# Patient Record
Sex: Female | Born: 1935 | Race: White | Hispanic: No | Smoking: Never smoker
Health system: Southern US, Community
[De-identification: ages and names within clinical notes are randomized; demographics above are authoritative.]

## PROBLEM LIST (undated history)

## (undated) DIAGNOSIS — N183 Chronic kidney disease, stage 3 unspecified: Secondary | ICD-10-CM

## (undated) DIAGNOSIS — B019 Varicella without complication: Secondary | ICD-10-CM

## (undated) DIAGNOSIS — D509 Iron deficiency anemia, unspecified: Secondary | ICD-10-CM

## (undated) DIAGNOSIS — I219 Acute myocardial infarction, unspecified: Secondary | ICD-10-CM

## (undated) DIAGNOSIS — I509 Heart failure, unspecified: Secondary | ICD-10-CM

## (undated) DIAGNOSIS — K219 Gastro-esophageal reflux disease without esophagitis: Secondary | ICD-10-CM

## (undated) DIAGNOSIS — Z8711 Personal history of peptic ulcer disease: Secondary | ICD-10-CM

## (undated) DIAGNOSIS — G20 Parkinson's disease: Secondary | ICD-10-CM

## (undated) DIAGNOSIS — R32 Unspecified urinary incontinence: Secondary | ICD-10-CM

## (undated) DIAGNOSIS — R51 Headache: Secondary | ICD-10-CM

## (undated) DIAGNOSIS — R519 Headache, unspecified: Secondary | ICD-10-CM

## (undated) DIAGNOSIS — I341 Nonrheumatic mitral (valve) prolapse: Secondary | ICD-10-CM

## (undated) DIAGNOSIS — Z8489 Family history of other specified conditions: Secondary | ICD-10-CM

## (undated) DIAGNOSIS — M1990 Unspecified osteoarthritis, unspecified site: Secondary | ICD-10-CM

## (undated) DIAGNOSIS — R112 Nausea with vomiting, unspecified: Secondary | ICD-10-CM

## (undated) DIAGNOSIS — F32A Depression, unspecified: Secondary | ICD-10-CM

## (undated) DIAGNOSIS — Z9889 Other specified postprocedural states: Secondary | ICD-10-CM

## (undated) DIAGNOSIS — E785 Hyperlipidemia, unspecified: Secondary | ICD-10-CM

## (undated) DIAGNOSIS — I10 Essential (primary) hypertension: Secondary | ICD-10-CM

## (undated) DIAGNOSIS — F329 Major depressive disorder, single episode, unspecified: Secondary | ICD-10-CM

## (undated) DIAGNOSIS — Z8719 Personal history of other diseases of the digestive system: Secondary | ICD-10-CM

## (undated) DIAGNOSIS — E7800 Pure hypercholesterolemia, unspecified: Secondary | ICD-10-CM

## (undated) HISTORY — PX: CATARACT EXTRACTION, BILATERAL: SHX1313

## (undated) HISTORY — DX: Depression, unspecified: F32.A

## (undated) HISTORY — DX: Personal history of peptic ulcer disease: Z87.11

## (undated) HISTORY — DX: Hyperlipidemia, unspecified: E78.5

## (undated) HISTORY — DX: Gastro-esophageal reflux disease without esophagitis: K21.9

## (undated) HISTORY — DX: Essential (primary) hypertension: I10

## (undated) HISTORY — DX: Varicella without complication: B01.9

## (undated) HISTORY — PX: APPENDECTOMY: SHX54

## (undated) HISTORY — PX: FRACTURE SURGERY: SHX138

## (undated) HISTORY — DX: Unspecified osteoarthritis, unspecified site: M19.90

## (undated) HISTORY — PX: CORONARY ARTERY BYPASS GRAFT: SHX141

## (undated) HISTORY — DX: Major depressive disorder, single episode, unspecified: F32.9

## (undated) HISTORY — DX: Unspecified urinary incontinence: R32

## (undated) HISTORY — DX: Personal history of other diseases of the digestive system: Z87.19

---

## 1997-11-15 DIAGNOSIS — I219 Acute myocardial infarction, unspecified: Secondary | ICD-10-CM

## 1997-11-15 HISTORY — DX: Acute myocardial infarction, unspecified: I21.9

## 2014-04-19 ENCOUNTER — Ambulatory Visit: Payer: Self-pay | Admitting: Internal Medicine

## 2014-04-26 ENCOUNTER — Ambulatory Visit (INDEPENDENT_AMBULATORY_CARE_PROVIDER_SITE_OTHER): Payer: Medicare Other | Admitting: Internal Medicine

## 2014-04-26 ENCOUNTER — Encounter (INDEPENDENT_AMBULATORY_CARE_PROVIDER_SITE_OTHER): Payer: Self-pay

## 2014-04-26 ENCOUNTER — Encounter: Payer: Self-pay | Admitting: Internal Medicine

## 2014-04-26 VITALS — BP 108/68 | HR 50 | Temp 98.2°F | Wt 103.0 lb

## 2014-04-26 DIAGNOSIS — F3289 Other specified depressive episodes: Secondary | ICD-10-CM

## 2014-04-26 DIAGNOSIS — M1990 Unspecified osteoarthritis, unspecified site: Secondary | ICD-10-CM

## 2014-04-26 DIAGNOSIS — F329 Major depressive disorder, single episode, unspecified: Secondary | ICD-10-CM

## 2014-04-26 DIAGNOSIS — K219 Gastro-esophageal reflux disease without esophagitis: Secondary | ICD-10-CM | POA: Insufficient documentation

## 2014-04-26 DIAGNOSIS — F32A Depression, unspecified: Secondary | ICD-10-CM | POA: Insufficient documentation

## 2014-04-26 DIAGNOSIS — R32 Unspecified urinary incontinence: Secondary | ICD-10-CM

## 2014-04-26 DIAGNOSIS — E785 Hyperlipidemia, unspecified: Secondary | ICD-10-CM

## 2014-04-26 DIAGNOSIS — I10 Essential (primary) hypertension: Secondary | ICD-10-CM

## 2014-04-26 DIAGNOSIS — M129 Arthropathy, unspecified: Secondary | ICD-10-CM

## 2014-04-26 LAB — CBC
HEMATOCRIT: 30.6 % — AB (ref 36.0–46.0)
Hemoglobin: 10.2 g/dL — ABNORMAL LOW (ref 12.0–15.0)
MCHC: 33.4 g/dL (ref 30.0–36.0)
MCV: 85.8 fl (ref 78.0–100.0)
Platelets: 253 10*3/uL (ref 150.0–400.0)
RBC: 3.57 Mil/uL — ABNORMAL LOW (ref 3.87–5.11)
RDW: 13.9 % (ref 11.5–15.5)
WBC: 5.6 10*3/uL (ref 4.0–10.5)

## 2014-04-26 LAB — COMPREHENSIVE METABOLIC PANEL
ALT: 12 U/L (ref 0–35)
AST: 25 U/L (ref 0–37)
Albumin: 3.4 g/dL — ABNORMAL LOW (ref 3.5–5.2)
Alkaline Phosphatase: 56 U/L (ref 39–117)
BUN: 23 mg/dL (ref 6–23)
CO2: 27 mEq/L (ref 19–32)
Calcium: 9.7 mg/dL (ref 8.4–10.5)
Chloride: 94 mEq/L — ABNORMAL LOW (ref 96–112)
Creatinine, Ser: 1.1 mg/dL (ref 0.4–1.2)
GFR: 51.32 mL/min — ABNORMAL LOW (ref >60.00)
Glucose, Bld: 90 mg/dL (ref 70–99)
Potassium: 4.4 mEq/L (ref 3.5–5.1)
Sodium: 129 mEq/L — ABNORMAL LOW (ref 135–145)
TOTAL PROTEIN: 6.9 g/dL (ref 6.0–8.3)
Total Bilirubin: 0.4 mg/dL (ref 0.2–1.2)

## 2014-04-26 LAB — LIPID PANEL
CHOLESTEROL: 199 mg/dL (ref 0–200)
HDL: 61.5 mg/dL (ref >39.00)
LDL Cholesterol: 125 mg/dL — ABNORMAL HIGH (ref 0–99)
NONHDL: 137.5
Total CHOL/HDL Ratio: 3
Triglycerides: 62 mg/dL (ref 0.0–149.0)
VLDL: 12.4 mg/dL (ref 0.0–40.0)

## 2014-04-26 NOTE — Assessment & Plan Note (Signed)
Mild Not medicated Has a good support group of friends and family

## 2014-04-26 NOTE — Assessment & Plan Note (Signed)
Occassional Has prilosec but doesn't use it Aware of triggers

## 2014-04-26 NOTE — Assessment & Plan Note (Signed)
BP on the low end Will continue clorthalidone, lisinopril, and toprol Will check cbc and cmet today  Plan to monitor BP closely and take away meds as needed

## 2014-04-26 NOTE — Assessment & Plan Note (Signed)
Mostly hands Does not take any med OTC

## 2014-04-26 NOTE — Progress Notes (Signed)
Pre visit review using our clinic review tool, if applicable. No additional management support is needed unless otherwise documented below in the visit note. 

## 2014-04-26 NOTE — Assessment & Plan Note (Signed)
Stable Not medicated at this time 

## 2014-04-26 NOTE — Progress Notes (Signed)
HPI  Pt presents to the clinic today to establish care. She is transferring care from Dr. Days office. She has no concerns today. She is accompanined by her daughter today who has noticed an increase in confusion. She has fallen (unknown how many times) but without injury. She uses a walker at home and a wheelchair when out. She is currently living with her daughter.  Flu: 08/2013 Tetanus: not within 10 year Pneumovax: unsure Zostovax: never Mammogram: never Pap Smear: never Colon Screening: never Bone Density: 2004 Eye Doctor: yearly Dentist: as needed  Past Medical History  Diagnosis Date  . Arthritis   . Chicken pox   . Depression   . Frequent headaches   . GERD (gastroesophageal reflux disease)   . History of stomach ulcers   . Chronic kidney disease   . Hyperlipidemia   . Hypertension   . Urine incontinence     Current Outpatient Prescriptions  Medication Sig Dispense Refill  . aspirin 325 MG tablet Take 325 mg by mouth daily.      . Calcium Carbonate-Vitamin D (CALCIUM 600/VITAMIN D) 600-400 MG-UNIT per tablet Take 1 tablet by mouth 3 (three) times daily with meals.      . chlorthalidone (HYGROTON) 25 MG tablet Take 25 mg by mouth daily.      Marland Kitchen. lisinopril (PRINIVIL,ZESTRIL) 20 MG tablet Take 20 mg by mouth daily.      Marland Kitchen. loratadine (CLARITIN) 10 MG tablet Take 10 mg by mouth daily.      . meclizine (ANTIVERT) 25 MG tablet Take 25 mg by mouth 2 (two) times daily as needed for dizziness.      . metoprolol succinate (TOPROL-XL) 25 MG 24 hr tablet Take 25 mg by mouth daily.      . Misc Natural Products (DAILY HERBS VISION HEALTH PO) Take 2 capsules by mouth daily.      . Multiple Vitamins-Minerals (MULTIVITAMIN & MINERAL PO) Take 1 capsule by mouth daily.       No current facility-administered medications for this visit.    Allergies  Allergen Reactions  . Penicillins Hives    Family History  Problem Relation Age of Onset  . Heart disease Father   . Hypertension  Father   . Cancer Sister     Breast  . Hyperlipidemia Sister   . Heart disease Sister   . Kidney disease Sister   . Hyperlipidemia Brother   . Heart disease Brother   . Cancer Son     Lung  . Kidney disease Son     History   Social History  . Marital Status: Widowed    Spouse Name: N/A    Number of Children: N/A  . Years of Education: N/A   Occupational History  . Not on file.   Social History Main Topics  . Smoking status: Never Smoker   . Smokeless tobacco: Never Used  . Alcohol Use: No  . Drug Use: No  . Sexual Activity: Not Currently   Other Topics Concern  . Not on file   Social History Narrative  . No narrative on file    ROS:  Constitutional: Denies fever, malaise, fatigue, headache or abrupt weight changes.  Respiratory: Denies difficulty breathing, shortness of breath, cough or sputum production.   Cardiovascular: Denies chest pain, chest tightness, palpitations or swelling in the hands or feet.  Gastrointestinal: Denies abdominal pain, bloating, constipation, diarrhea or blood in the stool.  GU: Pt reports frequency. Denies frequency, urgency, pain with urination, blood  in urine, odor or discharge. Musculoskeletal: Pt reports arthritis in hands. Denies decrease in range of motion, difficulty with gait, muscle pain.  Neurological: Pt reports dizziness and difficulty with memory. Denies difficulty with speech or problems with balance and coordination.   No other specific complaints in a complete review of systems (except as listed in HPI above).  PE:  BP 108/68  Pulse 50  Temp(Src) 98.2 F (36.8 C) (Oral)  Wt 103 lb (46.72 kg)  SpO2 98% Wt Readings from Last 3 Encounters:  04/26/14 103 lb (46.72 kg)    General: Appears her stated age, well developed, well nourished in NAD. Cardiovascular: Normal rate and rhythm. Murmur noted.  No rubs or gallops noted. No JVD or BLE edema. No carotid bruits noted. Pulmonary/Chest: Normal effort and positive  vesicular breath sounds. No respiratory distress. No wheezes, rales or ronchi noted.  Neurological: Alert and oriented.  Coordination normal. +DTRs bilaterally.    Assessment and Plan:

## 2014-04-26 NOTE — Assessment & Plan Note (Signed)
Refuses statins Will check lipid profile today

## 2014-04-26 NOTE — Patient Instructions (Addendum)

## 2014-04-27 ENCOUNTER — Telehealth: Payer: Self-pay | Admitting: Internal Medicine

## 2014-04-27 NOTE — Telephone Encounter (Signed)
Relevant patient education assigned to patient using Emmi. ° °

## 2014-05-01 ENCOUNTER — Other Ambulatory Visit: Payer: Self-pay

## 2014-05-02 ENCOUNTER — Other Ambulatory Visit: Payer: Self-pay

## 2014-05-02 MED ORDER — CHLORTHALIDONE 25 MG PO TABS: 25.0000 mg | ORAL_TABLET | Freq: Every day | ORAL | Status: DC

## 2014-05-02 NOTE — Telephone Encounter (Signed)
Lawson FiscalLori pts daughter request refill cholrothalidone to [facility]. Advised done.

## 2014-07-01 ENCOUNTER — Encounter: Payer: Self-pay | Admitting: Internal Medicine

## 2014-07-01 ENCOUNTER — Encounter (INDEPENDENT_AMBULATORY_CARE_PROVIDER_SITE_OTHER): Payer: Self-pay

## 2014-07-01 ENCOUNTER — Ambulatory Visit (INDEPENDENT_AMBULATORY_CARE_PROVIDER_SITE_OTHER): Payer: Medicare Other | Admitting: Internal Medicine

## 2014-07-01 VITALS — BP 130/82 | HR 66 | Temp 97.9°F | Wt 103.0 lb

## 2014-07-01 DIAGNOSIS — M21619 Bunion of unspecified foot: Secondary | ICD-10-CM

## 2014-07-01 DIAGNOSIS — M21612 Bunion of left foot: Secondary | ICD-10-CM

## 2014-07-01 NOTE — Progress Notes (Signed)
Subjective:    Patient ID: Lisa Evans, female    DOB: 28-Nov-1922, 80 y.o.   MRN: [id number]  HPI  Pt presents to the clinic today to have a bunion on her left foot checked. She reports she noticed this morning that there was redness around the bunion and that it was warm to touch. She denies any pain. She reports that she has been up on her feet more than usual. She has no history of gout that she is aware of.  Review of Systems      Past Medical History  Diagnosis Date  . Arthritis   . Chicken pox   . Depression   . Frequent headaches   . GERD (gastroesophageal reflux disease)   . History of stomach ulcers   . Chronic kidney disease   . Hyperlipidemia   . Hypertension   . Urine incontinence     Current Outpatient Prescriptions  Medication Sig Dispense Refill  . aspirin 325 MG tablet Take 325 mg by mouth daily.      . Calcium Carbonate-Vitamin D (CALCIUM 600/VITAMIN D) 600-400 MG-UNIT per tablet Take 1 tablet by mouth 3 (three) times daily with meals.      . chlorthalidone (HYGROTON) 25 MG tablet Take 1 tablet (25 mg total) by mouth daily.  30 tablet  5  . lisinopril (PRINIVIL,ZESTRIL) 20 MG tablet Take 20 mg by mouth daily.      Marland Kitchen. loratadine (CLARITIN) 10 MG tablet Take 10 mg by mouth daily.      . meclizine (ANTIVERT) 25 MG tablet Take 25 mg by mouth 2 (two) times daily as needed for dizziness.      . metoprolol succinate (TOPROL-XL) 25 MG 24 hr tablet Take 25 mg by mouth daily.      . Misc Natural Products (DAILY HERBS VISION HEALTH PO) Take 2 capsules by mouth daily.      . Multiple Vitamins-Minerals (MULTIVITAMIN & MINERAL PO) Take 1 capsule by mouth daily.       No current facility-administered medications for this visit.    Allergies  Allergen Reactions  . Penicillins Hives    Family History  Problem Relation Age of Onset  . Heart disease Father   . Hypertension Father   . Cancer Sister     Breast  . Hyperlipidemia Sister   . Heart disease Sister   .  Kidney disease Sister   . Hyperlipidemia Brother   . Heart disease Brother   . Cancer Son     Lung  . Kidney disease Son     History   Social History  . Marital Status: Widowed    Spouse Name: N/A    Number of Children: N/A  . Years of Education: N/A   Occupational History  . Not on file.   Social History Main Topics  . Smoking status: Never Smoker   . Smokeless tobacco: Never Used  . Alcohol Use: No  . Drug Use: No  . Sexual Activity: Not Currently   Other Topics Concern  . Not on file   Social History Narrative  . No narrative on file     Constitutional: Denies fever, malaise, fatigue, headache or abrupt weight changes.  Musculoskeletal: Pt reports a bunion. Denies decrease in range of motion, difficulty with gait, muscle pain    No other specific complaints in a complete review of systems (except as listed in HPI above).  Objective:   Physical Exam   BP 130/82  Pulse 66  Temp(Src) 97.9 F (36.6 C) (Oral)  Wt 103 lb (46.72 kg)  SpO2 97% Wt Readings from Last 3 Encounters:  07/01/14 103 lb (46.72 kg)  04/26/14 103 lb (46.72 kg)    General: Appears her stated age, well developed, well nourished in NAD. Skin: Warm, dry and intact. No rashes, lesions or ulcerations noted. Bunion slightly red. Musculoskeletal: Bilateral bunions noted. L>R. No pain with palpation. No swelling of the joint.  BMET    Component Value Date/Time   NA 129* 04/26/2014 1124   K 4.4 04/26/2014 1124   CL 94* 04/26/2014 1124   CO2 27 04/26/2014 1124   GLUCOSE 90 04/26/2014 1124   BUN 23 04/26/2014 1124   CREATININE 1.1 04/26/2014 1124   CALCIUM 9.7 04/26/2014 1124    Lipid Panel     Component Value Date/Time   CHOL 199 04/26/2014 1124   TRIG 62.0 04/26/2014 1124   HDL 61.50 04/26/2014 1124   CHOLHDL 3 04/26/2014 1124   VLDL 12.4 04/26/2014 1124   LDLCALC 125* 04/26/2014 1124    CBC    Component Value Date/Time   WBC 5.6 04/26/2014 1124   RBC 3.57* 04/26/2014 1124   HGB 10.2*  04/26/2014 1124   HCT 30.6* 04/26/2014 1124   PLT 253.0 04/26/2014 1124   MCV 85.8 04/26/2014 1124   MCHC 33.4 04/26/2014 1124   RDW 13.9 04/26/2014 1124    Hgb A1C No results found for this basename: HGBA1C        Assessment & Plan:   Bunion:  Not causing pain Slightly inflamed Advised her to avoid wearing shoes at the house if she can Keep the leg elevated to reduce swelling  RTC as needed or if symptoms persist or worsen

## 2014-07-01 NOTE — Progress Notes (Signed)
Pre visit review using our clinic review tool, if applicable. No additional management support is needed unless otherwise documented below in the visit note. 

## 2014-07-01 NOTE — Patient Instructions (Addendum)
Bunion Care  A bunion is a boney protrusion at the base of your big toe (metatarsal-phalangeal joint). This problem, if painful or troublesome can be corrected with surgery. This is an elective surgery, so you can pick a convenient time for the procedure. The surgery may:  · Improve appearance (cosmetic).  · Relieve pain.  · Improve function.  Your foot is made up of a complex set of twenty-six bones which are held together by tough fibrous ligaments. The movement of the foot is controlled by muscles in the foot and leg. These muscles attach to the foot by cord like structures (tendons) that attach muscle to bone.  If surgery is recommended, your caregiver will explain your foot problem and how surgery can improve it. Your caregiver can answer questions you may have about the potential risks and complications involved. After determining that foot surgery is necessary to correct your problem, you can proceed with plans for the surgery.  LET YOUR CAREGIVER KNOW ABOUT:   · Previous problems with anesthetics or medicines used to numb the skin.  · Allergies to dyes, iodine, foods, and/or latex.  · Medicines taken including herbs, eye drops, prescription medicines (especially medicines used to "thin the blood"), aspirin and other over-the-counter medicines, and steroids (by mouth or as a cream).  · History of bleeding or blood problems.  · Possibility of pregnancy, if this applies.  · History of blood clots in your legs and/or lungs.  · Previous surgery.  · Other important health problems.  Let your caregiver know about health changes prior to surgery.  BEFORE THE PROCEDURE   You should be present 60 minutes prior to your procedure or as directed.   PROCEDURE  BUNION TYPES AND THEIR TREATMENTS  · Positional Bunion. A positional bunion develops when a bony growth on the side of the metatarsal bone enlarges the joint. The metatarsal forces the joint capsule to stretch over it. As this growth pushes the big toe toward the  others, the tendons on the inside tighten. This forces the big toe farther out of alignment. The bunion presses against the shoe, irritating the skin and causes further pain and disability.  ¨ Positional Bunionectomy Treatment. The bunion is removed. Tight tendons may be released.  ¨ Follow-up Care. Your toe is apt to be stiff at first but will loosen up as you move it. You may need to wear a special surgical shoe and, possibly, a splint for about three weeks.  · Mild Structural Bunion. Structural bunions occur when the angle between the first and second metatarsal bones increases to a point where it is greater than normal. The increased angle of the metatarsals makes the big toe slant toward the other toes. Sometimes bony growths may form. Irritation and swelling often follow.  ¨ Structural Bunionectomy Treatment. Your caregiver surgically repositions the bone by decreasing the angle and may use a fixation device to hold it together. The bunion (bump) is also removed.  · Degenerative Joint Disease (Arthritis). When wear-and-tear arthritis (osteoarthritis) of aging affects the big toe joint, pain and reduced joint motion may result. This is not a true bunion but may be associated with bunions. Left untreated, it can increase wear and tear in the joint and break down the cartilage. Pain and stiffness are problems of both wear-and-tear arthritis and rheumatoid arthritis.  · Arthroplasty With Joint Implantation As Treatment. The bunion is first removed; then the degenerated joint is removed and replaced with an implant.  AFTER THE PROCEDURE     After surgery your bone will heal in phases. A callous (new bone formation) forms, bridging the damaged bone allowing it to heal. In about 6 months, the bone is back to normal strength along with a return of nearly normal function. It is best to do elective surgeries when your health is optimal.   After surgery, you will be taken to the recovery area where a nurse will watch and  check your progress. Once you are awake, stable, and taking fluids well, barring other problems you will be allowed to go home.  HOME CARE INSTRUCTIONS   Be sure to ask your caregiver how long you will be off your feet and home from work. Plan accordingly. There are several types of bunions and varying surgical treatments for each. Common types are explained above. Your surgery may be similar and may include a fixation device (such as a small screw). Your foot and ankle may be immobilized by a cast (from your toes to below your knee). You may be asked not to bear weight on this foot for a few weeks or until comfortable.  Once home, an ice pack applied to your operative site may help with discomfort and keep the swelling down. You may be able to walk a day or two after surgery. Your podiatrist may prescribe a splint or a special shoe to be worn for several weeks. Only take over-the-counter or prescription medicines for pain, discomfort, or fever as directed by your caregiver.   SEEK MEDICAL CARE IF:   · There is increased bleeding (more than a small spot) from the surgical site.  · You notice redness, swelling, or increasing pain in the surgical site.  · Pus is coming from the site.  · An unexplained oral temperature above 102° F (38.9° C) develops.  · You notice a foul smell coming from the surgical site or dressing.  SEEK IMMEDIATE MEDICAL CARE IF:   You develop a rash, have difficulty breathing, or have any allergic problems with medications.  Document Released: 10/29/2000 Document Revised: 01/24/2012 Document Reviewed: 11/04/2008  ExitCare® Patient Information ©2015 [employer]. This information is not intended to replace advice given to you by your health care provider. Make sure you discuss any questions you have with your health care provider.

## 2014-07-19 ENCOUNTER — Encounter (HOSPITAL_COMMUNITY): Payer: Self-pay | Admitting: Emergency Medicine

## 2014-07-19 ENCOUNTER — Emergency Department (HOSPITAL_COMMUNITY): Payer: Medicare Other

## 2014-07-19 ENCOUNTER — Observation Stay (HOSPITAL_COMMUNITY)
Admission: EM | Admit: 2014-07-19 | Discharge: 2014-07-20 | Disposition: A | Discharge status: Home / Self Care | Payer: Medicare Other | Attending: Internal Medicine | Admitting: Internal Medicine

## 2014-07-19 DIAGNOSIS — R071 Chest pain on breathing: Secondary | ICD-10-CM | POA: Diagnosis not present

## 2014-07-19 DIAGNOSIS — F329 Major depressive disorder, single episode, unspecified: Secondary | ICD-10-CM | POA: Insufficient documentation

## 2014-07-19 DIAGNOSIS — Z8619 Personal history of other infectious and parasitic diseases: Secondary | ICD-10-CM | POA: Insufficient documentation

## 2014-07-19 DIAGNOSIS — I2510 Atherosclerotic heart disease of native coronary artery without angina pectoris: Secondary | ICD-10-CM

## 2014-07-19 DIAGNOSIS — M1990 Unspecified osteoarthritis, unspecified site: Secondary | ICD-10-CM

## 2014-07-19 DIAGNOSIS — Z7982 Long term (current) use of aspirin: Secondary | ICD-10-CM | POA: Diagnosis not present

## 2014-07-19 DIAGNOSIS — Z79899 Other long term (current) drug therapy: Secondary | ICD-10-CM | POA: Diagnosis not present

## 2014-07-19 DIAGNOSIS — Z880 Allergy status to penicillin: Secondary | ICD-10-CM | POA: Diagnosis not present

## 2014-07-19 DIAGNOSIS — K219 Gastro-esophageal reflux disease without esophagitis: Secondary | ICD-10-CM | POA: Insufficient documentation

## 2014-07-19 DIAGNOSIS — E785 Hyperlipidemia, unspecified: Secondary | ICD-10-CM | POA: Insufficient documentation

## 2014-07-19 DIAGNOSIS — N189 Chronic kidney disease, unspecified: Secondary | ICD-10-CM | POA: Diagnosis not present

## 2014-07-19 DIAGNOSIS — I498 Other specified cardiac arrhythmias: Secondary | ICD-10-CM

## 2014-07-19 DIAGNOSIS — R001 Bradycardia, unspecified: Secondary | ICD-10-CM

## 2014-07-19 DIAGNOSIS — Z951 Presence of aortocoronary bypass graft: Secondary | ICD-10-CM | POA: Insufficient documentation

## 2014-07-19 DIAGNOSIS — I10 Essential (primary) hypertension: Secondary | ICD-10-CM | POA: Diagnosis present

## 2014-07-19 DIAGNOSIS — F3289 Other specified depressive episodes: Secondary | ICD-10-CM | POA: Insufficient documentation

## 2014-07-19 DIAGNOSIS — I129 Hypertensive chronic kidney disease with stage 1 through stage 4 chronic kidney disease, or unspecified chronic kidney disease: Secondary | ICD-10-CM | POA: Diagnosis not present

## 2014-07-19 DIAGNOSIS — F32A Depression, unspecified: Secondary | ICD-10-CM

## 2014-07-19 DIAGNOSIS — M129 Arthropathy, unspecified: Secondary | ICD-10-CM | POA: Diagnosis not present

## 2014-07-19 DIAGNOSIS — R079 Chest pain, unspecified: Secondary | ICD-10-CM | POA: Diagnosis present

## 2014-07-19 LAB — CBC WITH DIFFERENTIAL/PLATELET
Basophils Absolute: 0 10*3/uL (ref 0.0–0.1)
Basophils Relative: 1 % (ref 0–1)
Eosinophils Absolute: 0 10*3/uL (ref 0.0–0.7)
Eosinophils Relative: 1 % (ref 0–5)
HCT: 30.5 % — ABNORMAL LOW (ref 36.0–46.0)
Hemoglobin: 10.2 g/dL — ABNORMAL LOW (ref 12.0–15.0)
Lymphocytes Relative: 11 % — ABNORMAL LOW (ref 12–46)
Lymphs Abs: 0.7 10*3/uL (ref 0.7–4.0)
MCH: 28.6 pg (ref 26.0–34.0)
MCHC: 33.4 g/dL (ref 30.0–36.0)
MCV: 85.4 fL (ref 78.0–100.0)
Monocytes Absolute: 0.5 10*3/uL (ref 0.1–1.0)
Monocytes Relative: 7 % (ref 3–12)
Neutro Abs: 5.3 10*3/uL (ref 1.7–7.7)
Neutrophils Relative %: 80 % — ABNORMAL HIGH (ref 43–77)
Platelets: 232 10*3/uL (ref 150–400)
RBC: 3.57 MIL/uL — ABNORMAL LOW (ref 3.87–5.11)
RDW: 13.5 % (ref 11.5–15.5)
WBC: 6.5 10*3/uL (ref 4.0–10.5)

## 2014-07-19 LAB — COMPREHENSIVE METABOLIC PANEL WITH GFR
ALT: 9 U/L (ref 0–35)
AST: 23 U/L (ref 0–37)
Albumin: 3.3 g/dL — ABNORMAL LOW (ref 3.5–5.2)
Alkaline Phosphatase: 64 U/L (ref 39–117)
Anion gap: 13 (ref 5–15)
BUN: 31 mg/dL — ABNORMAL HIGH (ref 6–23)
CO2: 25 meq/L (ref 19–32)
Calcium: 9.8 mg/dL (ref 8.4–10.5)
Chloride: 95 meq/L — ABNORMAL LOW (ref 96–112)
Creatinine, Ser: 1.15 mg/dL — ABNORMAL HIGH (ref 0.50–1.10)
GFR calc Af Amer: 47 mL/min — ABNORMAL LOW
GFR calc non Af Amer: 41 mL/min — ABNORMAL LOW
Glucose, Bld: 86 mg/dL (ref 70–99)
Potassium: 4.9 meq/L (ref 3.7–5.3)
Sodium: 133 meq/L — ABNORMAL LOW (ref 137–147)
Total Bilirubin: 0.3 mg/dL (ref 0.3–1.2)
Total Protein: 6.9 g/dL (ref 6.0–8.3)

## 2014-07-19 LAB — TROPONIN I: Troponin I: <0.3 ng/mL (ref <0.30)

## 2014-07-19 LAB — I-STAT TROPONIN, ED
TROPONIN I, POC: 0.02 ng/mL (ref 0.00–0.08)
Troponin i, poc: 0.02 ng/mL (ref 0.00–0.08)

## 2014-07-19 MED ORDER — CALCIUM CARBONATE-VITAMIN D 600-400 MG-UNIT PO TABS
1.0000 | ORAL_TABLET | Freq: Every day | ORAL | Status: DC
  Administered 2014-07-20: 1 via ORAL
  Filled 2014-07-19: qty 1

## 2014-07-19 MED ORDER — ASPIRIN 325 MG PO TABS
325.0000 mg | ORAL_TABLET | Freq: Every day | ORAL | Status: DC
  Administered 2014-07-20: 325 mg via ORAL
  Filled 2014-07-19: qty 1

## 2014-07-19 MED ORDER — MORPHINE SULFATE 2 MG/ML IJ SOLN: 2.0000 mg | INTRAMUSCULAR | Status: DC | PRN

## 2014-07-19 MED ORDER — GI COCKTAIL ~~LOC~~: 30.0000 mL | Freq: Four times a day (QID) | ORAL | Status: DC | PRN

## 2014-07-19 MED ORDER — ACETAMINOPHEN 325 MG PO TABS: 650.0000 mg | ORAL_TABLET | ORAL | Status: DC | PRN

## 2014-07-19 MED ORDER — ONDANSETRON HCL 4 MG/2ML IJ SOLN: 4.0000 mg | Freq: Three times a day (TID) | INTRAMUSCULAR | Status: DC | PRN

## 2014-07-19 MED ORDER — HEPARIN SODIUM (PORCINE) 5000 UNIT/ML IJ SOLN
5000.0000 [IU] | Freq: Three times a day (TID) | INTRAMUSCULAR | Status: DC
  Administered 2014-07-19 – 2014-07-20 (×3): 5000 [IU] via SUBCUTANEOUS
  Filled 2014-07-19 (×5): qty 1

## 2014-07-19 MED ORDER — RISAQUAD PO CAPS
2.0000 | ORAL_CAPSULE | Freq: Every day | ORAL | Status: DC
  Administered 2014-07-20: 2 via ORAL
  Filled 2014-07-19: qty 2

## 2014-07-19 MED ORDER — METOPROLOL SUCCINATE ER 25 MG PO TB24
25.0000 mg | ORAL_TABLET | Freq: Every day | ORAL | Status: DC
  Administered 2014-07-20: 25 mg via ORAL
  Filled 2014-07-19: qty 1

## 2014-07-19 MED ORDER — MECLIZINE HCL 25 MG PO TABS
25.0000 mg | ORAL_TABLET | Freq: Every day | ORAL | Status: DC
  Administered 2014-07-20: 25 mg via ORAL
  Filled 2014-07-19: qty 1

## 2014-07-19 MED ORDER — ISOSORBIDE MONONITRATE ER 30 MG PO TB24
30.0000 mg | ORAL_TABLET | Freq: Every day | ORAL | Status: DC
  Administered 2014-07-19: 30 mg via ORAL
  Filled 2014-07-19 (×2): qty 1

## 2014-07-19 MED ORDER — NITROGLYCERIN 0.4 MG SL SUBL: 0.4000 mg | SUBLINGUAL_TABLET | SUBLINGUAL | Status: DC | PRN

## 2014-07-19 MED ORDER — ONDANSETRON HCL 4 MG/2ML IJ SOLN
4.0000 mg | Freq: Four times a day (QID) | INTRAMUSCULAR | Status: DC | PRN
Start: 2014-07-19 — End: 2014-07-20

## 2014-07-19 MED ORDER — LISINOPRIL 20 MG PO TABS
20.0000 mg | ORAL_TABLET | Freq: Every day | ORAL | Status: DC
Start: 2014-07-20 — End: 2014-07-20
  Administered 2014-07-20: 20 mg via ORAL
  Filled 2014-07-19: qty 1

## 2014-07-19 MED ORDER — CLINDAMYCIN HCL 300 MG PO CAPS
300.0000 mg | ORAL_CAPSULE | Freq: Three times a day (TID) | ORAL | Status: DC
  Administered 2014-07-19 – 2014-07-20 (×3): 300 mg via ORAL
  Filled 2014-07-19 (×4): qty 1

## 2014-07-19 MED ORDER — CHLORTHALIDONE 25 MG PO TABS
25.0000 mg | ORAL_TABLET | Freq: Every day | ORAL | Status: DC
  Administered 2014-07-20: 25 mg via ORAL
  Filled 2014-07-19: qty 1

## 2014-07-19 MED ORDER — LORATADINE 10 MG PO TABS
10.0000 mg | ORAL_TABLET | Freq: Every day | ORAL | Status: DC
  Administered 2014-07-20: 10 mg via ORAL
  Filled 2014-07-19 (×2): qty 1

## 2014-07-19 NOTE — ED Notes (Signed)
Patient states right sided chest pain lasting a few minutes this am, patient states some nausea but vomiting or diaphoresis

## 2014-07-19 NOTE — Consult Note (Signed)
Patient Name: [name] Date of Encounter: 07/19/2014  Primary Care Provider:  Nicki Reaper, NP Primary Cardiologist: none  Problem List   Past Medical History  Diagnosis Date  . Arthritis   . Chicken pox   . Depression   . Frequent headaches   . GERD (gastroesophageal reflux disease)   . History of stomach ulcers   . Chronic kidney disease   . Hyperlipidemia   . Hypertension   . Urine incontinence    Past Surgical History  Procedure Laterality Date  . Coronary artery bypass graft      x 4    Allergies  Allergies  Allergen Reactions  . Penicillins Hives    HPI  Lisa Evans is a 80 year old female with h/o CAD, 3 VD on cath in 2000 at [facility], s/p CABG x 3 in 2000. The patient was doing well and living on her until March of this year when she moved in with her daughter for worsening vision and slowly declining function. Until the last year she used to drive and was very independent. She is currently minimally active and gets SOB while walking with a walker around the house. She blames it on deconditioning.  Three years ago she was admitted with hypertensive urgency associated chest pain and dizziness.  Today she developed three episodes of exertional chest tightness and DOE that lasted only few minutes and resolved at rest. When her daughter checked her BP it was up to 180 mmHg.  She denies any associated dizziness, denies diaphoresis. No syncope.  Home Medications  Prior to Admission medications   Medication Sig Start Date End Date Taking? Authorizing Provider  Artificial Tear Ointment (REFRESH LACRI-LUBE) OINT Place 1 application into both eyes at bedtime.   Yes Historical Provider, MD  aspirin 325 MG tablet Take 325 mg by mouth daily.   Yes Historical Provider, MD  Calcium Carbonate-Vitamin D (CALCIUM 600/VITAMIN D) 600-400 MG-UNIT per tablet Take 1 tablet by mouth daily.    Yes Historical Provider, MD  chlorthalidone (HYGROTON) 25 MG tablet Take 1 tablet (25 mg  total) by mouth daily. 05/02/14  Yes Lorre Munroe, NP  clindamycin (CLEOCIN) 300 MG capsule Take 300 mg by mouth 3 (three) times daily. Starting 9/1 1 dose   Yes Historical Provider, MD  lisinopril (PRINIVIL,ZESTRIL) 20 MG tablet Take 20 mg by mouth daily.   Yes Historical Provider, MD  loratadine (CLARITIN) 10 MG tablet Take 10 mg by mouth daily.   Yes Historical Provider, MD  meclizine (ANTIVERT) 25 MG tablet Take 25 mg by mouth daily.    Yes Historical Provider, MD  metoprolol succinate (TOPROL-XL) 25 MG 24 hr tablet Take 25 mg by mouth daily.   Yes Historical Provider, MD  Misc Natural Products (DAILY HERBS VISION HEALTH PO) Take 1 capsule by mouth daily.    Yes Historical Provider, MD  Multiple Vitamins-Minerals (MULTIVITAMIN & MINERAL PO) Take 1 capsule by mouth daily.   Yes Historical Provider, MD    Family History  Family History  Problem Relation Age of Onset  . Heart disease Father   . Hypertension Father   . Cancer Sister     Breast  . Hyperlipidemia Sister   . Heart disease Sister   . Kidney disease Sister   . Hyperlipidemia Brother   . Heart disease Brother   . Cancer Son     Lung  . Kidney disease Son     Social History  History   Social History  .  Marital Status: Widowed    Spouse Name: N/A    Number of Children: N/A  . Years of Education: N/A   Occupational History  . Not on file.   Social History Main Topics  . Smoking status: Never Smoker   . Smokeless tobacco: Never Used  . Alcohol Use: No  . Drug Use: No  . Sexual Activity: Not Currently   Other Topics Concern  . Not on file   Social History Narrative  . No narrative on file     Review of Systems, as per HPI, otherwise negative General:  No chills, fever, night sweats or weight changes.  Cardiovascular:  No chest pain, dyspnea on exertion, edema, orthopnea, palpitations, paroxysmal nocturnal dyspnea. Dermatological: No rash, lesions/masses Respiratory: No cough, dyspnea Urologic: No  hematuria, dysuria Abdominal:   No nausea, vomiting, diarrhea, bright red blood per rectum, melena, or hematemesis Neurologic:  No visual changes, wkns, changes in mental status. All other systems reviewed and are otherwise negative except as noted above.  Physical Exam  Blood pressure 134/48, pulse 53, temperature 97.5 F (36.4 C), temperature source Oral, resp. rate 16, height 5' (1.524 m), weight 98 lb 5.2 oz (44.6 kg), SpO2 100.00%.  General: Pleasant, NAD Psych: Normal affect. Neuro: Alert and oriented X 3. Moves all extremities spontaneously. HEENT: Normal  Neck: Supple without bruits or JVD. Lungs:  Resp regular and unlabored, CTA. Heart: RRR no s3, s4, 3/6 systolic murmur. Abdomen: Soft, non-tender, non-distended, BS + x 4.  Extremities: No clubbing, cyanosis or edema. DP/PT/Radials 2+ and equal bilaterally.  Labs:  No results found for this basename: CKTOTAL, CKMB, TROPONINI,  in the last 72 hours Lab Results  Component Value Date   WBC 6.5 07/19/2014   HGB 10.2* 07/19/2014   HCT 30.5* 07/19/2014   MCV 85.4 07/19/2014   PLT 232 07/19/2014    No results found for this basename: DDIMER   No components found with this basename: POCBNP,     Component Value Date/Time   NA 133* 07/19/2014 1303   K 4.9 07/19/2014 1303   CL 95* 07/19/2014 1303   CO2 25 07/19/2014 1303   GLUCOSE 86 07/19/2014 1303   BUN 31* 07/19/2014 1303   CREATININE 1.15* 07/19/2014 1303   CALCIUM 9.8 07/19/2014 1303   PROT 6.9 07/19/2014 1303   ALBUMIN 3.3* 07/19/2014 1303   AST 23 07/19/2014 1303   ALT 9 07/19/2014 1303   ALKPHOS 64 07/19/2014 1303   BILITOT 0.3 07/19/2014 1303   GFRNONAA 41* 07/19/2014 1303   GFRAA 47* 07/19/2014 1303   Lab Results  Component Value Date   CHOL 199 04/26/2014   HDL 61.50 04/26/2014   LDLCALC 125* 04/26/2014   TRIG 62.0 04/26/2014    Accessory Clinical Findings  Echocardiogram - none  ECG - Sinus bradycardia, LVH with repolarization abnormalities, unchanged when compared to ECG from  1997    Assessment & Plan  80 year old female   1. CAD, s/p CABG in 2000 with typical exertional angina in the settings of hypertensive urgency. Troponin negative x 3, ECG unchanged from 1997. We can't increase metoprolol as she is in sinus bradycardia, nor ACEI as she has CKD stage III. I will add Imdur 30 mg po daily to her regimen, and if her BP is controlled I would have her walk with PT tomorrow. If no chest pain discharge home. If persistent chest pain, consider cardiac cath. Considering she is not very inclined to cardiac cath (not totally oposed) and  DNR I would incline to medical management unless she has recurrent angina.  2. HTN urgency - as above  3. Hyperlipidemia - start atorvastatin 10 mg po daily  4. Systolic murmur -order echo    Lars Masson, MD, [facility] 07/19/2014, 6:58 PM

## 2014-07-19 NOTE — ED Provider Notes (Signed)
CSN: [id number]     Arrival date & time 07/19/14  1253 History   First MD Initiated Contact with Patient 07/19/14 1253     Chief Complaint  Patient presents with  . Chest Pain     (Consider location/radiation/quality/duration/timing/severity/associated sxs/prior Treatment) HPI Lisa Evans is a 80 y.o. female who presents to emergency department with an episode of chest pain. Patient states she developed right-sided chest pain shortly after eating breakfast. She states pain lasted only a few minutes at a time but came and went. She denies any associated shortness of breath, denies any dizziness, denies diaphoresis. Denies any injuries to her chest. She states she has had prior heart attacks but this does not feel the same. She states she took a few times to see if it will help and states pain seemed to subside since. She denies any current symptoms. She denies any cough. No fever, chills. She denies any other complaints the  4:13 PM Daughter now bedside, states that patient has had intermittent chest pains all day today. States she was on the way to take her to primary care Dr. when she slumped over clenching a fist to her chest in severe pain. At that time she called 911. Patient has also had progressive shortness of breath and weakness especially on exertion over last few weeks.   Past Medical History  Diagnosis Date  . Arthritis   . Chicken pox   . Depression   . Frequent headaches   . GERD (gastroesophageal reflux disease)   . History of stomach ulcers   . Chronic kidney disease   . Hyperlipidemia   . Hypertension   . Urine incontinence    Past Surgical History  Procedure Laterality Date  . Coronary artery bypass graft      x 4   Family History  Problem Relation Age of Onset  . Heart disease Father   . Hypertension Father   . Cancer Sister     Breast  . Hyperlipidemia Sister   . Heart disease Sister   . Kidney disease Sister   . Hyperlipidemia Brother   . Heart disease  Brother   . Cancer Son     Lung  . Kidney disease Son    History  Substance Use Topics  . Smoking status: Never Smoker   . Smokeless tobacco: Never Used  . Alcohol Use: No   OB History   Grav Para Term Preterm Abortions TAB SAB Ect Mult Living                 Review of Systems  Constitutional: Negative for fever and chills.  Respiratory: Negative for cough, chest tightness and shortness of breath.   Cardiovascular: Negative for chest pain, palpitations and leg swelling.  Gastrointestinal: Negative for nausea, vomiting, abdominal pain and diarrhea.  Genitourinary: Negative for dysuria, flank pain and pelvic pain.  Musculoskeletal: Negative for arthralgias, myalgias, neck pain and neck stiffness.  Skin: Negative for rash.  Neurological: Negative for dizziness, weakness and headaches.  All other systems reviewed and are negative.     Allergies  Penicillins  Home Medications   Prior to Admission medications   Medication Sig Start Date End Date Taking? Authorizing Provider  aspirin 325 MG tablet Take 325 mg by mouth daily.    Historical Provider, MD  Calcium Carbonate-Vitamin D (CALCIUM 600/VITAMIN D) 600-400 MG-UNIT per tablet Take 1 tablet by mouth 3 (three) times daily with meals.    Historical Provider, MD  chlorthalidone (HYGROTON) 25  MG tablet Take 1 tablet (25 mg total) by mouth daily. 05/02/14   Lorre Munroe, NP  lisinopril (PRINIVIL,ZESTRIL) 20 MG tablet Take 20 mg by mouth daily.    Historical Provider, MD  loratadine (CLARITIN) 10 MG tablet Take 10 mg by mouth daily.    Historical Provider, MD  meclizine (ANTIVERT) 25 MG tablet Take 25 mg by mouth 2 (two) times daily as needed for dizziness.    Historical Provider, MD  metoprolol succinate (TOPROL-XL) 25 MG 24 hr tablet Take 25 mg by mouth daily.    Historical Provider, MD  Misc Natural Products (DAILY HERBS VISION HEALTH PO) Take 2 capsules by mouth daily.    Historical Provider, MD  Multiple Vitamins-Minerals  (MULTIVITAMIN & MINERAL PO) Take 1 capsule by mouth daily.    Historical Provider, MD   BP 175/65  Pulse 53  Resp 16  SpO2 99% Physical Exam  Nursing note and vitals reviewed. Constitutional: She is oriented to person, place, and time. She appears well-developed and well-nourished. No distress.  HENT:  Head: Normocephalic.  Eyes: Conjunctivae are normal.  Neck: Neck supple.  Cardiovascular: Normal rate, regular rhythm and normal heart sounds.   Pulmonary/Chest: Effort normal and breath sounds normal. No respiratory distress. She has no wheezes. She has no rales. She exhibits tenderness.  Tenderness over right chest wall  Abdominal: Soft. Bowel sounds are normal. She exhibits no distension. There is no tenderness. There is no rebound.  Musculoskeletal: She exhibits no edema.  Dorsal pedal pulses intact bilaterally  Neurological: She is alert and oriented to person, place, and time.  Skin: Skin is warm and dry.  Psychiatric: She has a normal mood and affect. Her behavior is normal.    ED Course  Procedures (including critical care time) Labs Review Labs Reviewed  CBC WITH DIFFERENTIAL - Abnormal; Notable for the following:    RBC 3.57 (*)    Hemoglobin 10.2 (*)    HCT 30.5 (*)    Neutrophils Relative % 80 (*)    Lymphocytes Relative 11 (*)    All other components within normal limits  COMPREHENSIVE METABOLIC PANEL - Abnormal; Notable for the following:    Sodium 133 (*)    Chloride 95 (*)    BUN 31 (*)    Creatinine, Ser 1.15 (*)    Albumin 3.3 (*)    GFR calc non Af Amer 41 (*)    GFR calc Af Amer 47 (*)    All other components within normal limits  I-STAT TROPOININ, ED  I-STAT TROPOININ, ED    Imaging Review No results found.   EKG Interpretation   Date/Time:  Friday July 19 2014 12:54:03 EDT Ventricular Rate:  53 PR Interval:  206 QRS Duration: 98 QT Interval:  467 QTC Calculation: 438 R Axis:   53 Text Interpretation:  Sinus rhythm Atrial premature  complex Left  ventricular hypertrophy Inferior infarct, old Non specifi ST and T wave  changes Inferior and lateral q waves No comparison available Confirmed by  Rhunette Croft, MD, Janey Genta [phone]) on 07/19/2014 1:06:59 PM      MDM   Final diagnoses:  Chest pain, unspecified chest pain type    Patient with chest pain, currently chest pain-free. Last MI in 2001, at that time CABG. Has not followed up with cardiology since. Saw cardiologists at [facility]. Will get labs, ECG, CXR, will monitor.    4:19 PM Trop and labs unremarkable. CXR normal. Discussed with Dr. Rhunette Croft, who spent a long time speaking  to her and her daughter. Daughter very concerned about pt's chest pains and weakness. Will get admitted to medicine for rule out.   Filed Vitals:   07/19/14 1257 07/19/14 1415  BP: 175/65 143/46  Pulse: 53 49  Resp: 16 18  SpO2: 99% 99%     4:25 PM SPoke with [facility], will admit.   Filed Vitals:   07/19/14 1257 07/19/14 1415  BP: 175/65 143/46  Pulse: 53 49  Resp: 16 18  SpO2: 99% 99%     Yaiden Yang A Emmajo Bennette, PA-C 07/19/14 1625

## 2014-07-19 NOTE — H&P (Addendum)
Patient Demographics  Lisa Evans, is a 80 y.o. female  MRN: [id number]   DOB - 1923-09-01  Admit Date - 07/19/2014  Outpatient Primary MD for the patient is Nicki Reaper, NP   With History of -  Past Medical History  Diagnosis Date  . Arthritis   . Chicken pox   . Depression   . Frequent headaches   . GERD (gastroesophageal reflux disease)   . History of stomach ulcers   . Chronic kidney disease   . Hyperlipidemia   . Hypertension   . Urine incontinence       Past Surgical History  Procedure Laterality Date  . Coronary artery bypass graft      x 4    in for   Chief Complaint  Patient presents with  . Chest Pain     HPI  Lisa Evans  is a 80 y.o. female, presents with complaints of chest pain, midsternal, towards the right, nonradiating, pressure like quality, reports initial episode happened after eating, this morning, as well it reoccurred upon  she was trying to take a shower, and while she was walking to the car, and relieved with rest, denies any shortness of breath, diaphoresis, nausea or vomiting, palpitation or lightheadedness, patient reports she had history of coronary artery disease in the past status post CABG almost 14 years ago, reports then there was no chest pain, only presenting symptom was nausea, in ED patient first troponin was negative, her EKG did not show any significant ST or T wave abnormalities, patient  was given 325 mg of aspirin by her daughter, she got her daily dose around 12:30, and when the chest pain happened she took another dose of 325 mg oral given by her daughter as well,  hospitalist requested to admit for further evaluation. Daughter reports patient had tooth abscess where she is taking by mouth clindamycin for this, with the plan to extract 2 next month, she denies any fever or chills.   Review of Systems    In addition to the HPI above,  No Fever-chills, No Headache, No changes with Vision or hearing, No problems  swallowing food or Liquids, Complaints of Chest pain, denies Cough or Shortness of Breath, No Abdominal pain, No Nausea or Vommitting, Bowel movements are regular, No Blood in stool or Urine, No dysuria, No new skin rashes or bruises, No new joints pains-aches,  No new weakness, tingling, numbness in any extremity, No recent weight gain or loss, No polyuria, polydypsia or polyphagia, No significant Mental Stressors.  A full 10 point Review of Systems was done, except as stated above, all other Review of Systems were negative.   Social History History  Substance Use Topics  . Smoking status: Never Smoker   . Smokeless tobacco: Never Used  . Alcohol Use: No     Family History Family History  Problem Relation Age of Onset  . Heart disease Father   . Hypertension Father   . Cancer Sister     Breast  . Hyperlipidemia Sister   . Heart disease Sister   . Kidney disease Sister   . Hyperlipidemia Brother   . Heart disease Brother   . Cancer Son     Lung  . Kidney disease Son      Prior to Admission medications   Medication Sig Start Date End Date Taking? Authorizing Provider  Artificial Tear Ointment (REFRESH LACRI-LUBE) OINT Place 1 application into both eyes at bedtime.   Yes Historical Provider,  MD  aspirin 325 MG tablet Take 325 mg by mouth daily.   Yes Historical Provider, MD  Calcium Carbonate-Vitamin D (CALCIUM 600/VITAMIN D) 600-400 MG-UNIT per tablet Take 1 tablet by mouth daily.    Yes Historical Provider, MD  chlorthalidone (HYGROTON) 25 MG tablet Take 1 tablet (25 mg total) by mouth daily. 05/02/14  Yes Lorre Munroe, NP  clindamycin (CLEOCIN) 300 MG capsule Take 300 mg by mouth 3 (three) times daily. Starting 9/1 1 dose   Yes Historical Provider, MD  lisinopril (PRINIVIL,ZESTRIL) 20 MG tablet Take 20 mg by mouth daily.   Yes Historical Provider, MD  loratadine (CLARITIN) 10 MG tablet Take 10 mg by mouth daily.   Yes Historical Provider, MD  meclizine (ANTIVERT)  25 MG tablet Take 25 mg by mouth daily.    Yes Historical Provider, MD  metoprolol succinate (TOPROL-XL) 25 MG 24 hr tablet Take 25 mg by mouth daily.   Yes Historical Provider, MD  Misc Natural Products (DAILY HERBS VISION HEALTH PO) Take 1 capsule by mouth daily.    Yes Historical Provider, MD  Multiple Vitamins-Minerals (MULTIVITAMIN & MINERAL PO) Take 1 capsule by mouth daily.   Yes Historical Provider, MD    Allergies  Allergen Reactions  . Penicillins Hives    Physical Exam  Vitals  Blood pressure 145/59, pulse 49, resp. rate 21, SpO2 99.00%.   1. General elderly female lying in bed in NAD,    2. Normal affect and insight, Not Suicidal or Homicidal, Awake Alert, Oriented X 3.  3. No F.N deficits, ALL C.Nerves Intact, Strength 5/5 all 4 extremities, Sensation intact all 4 extremities, Plantars down going.  4. Ears and Eyes appear Normal, Conjunctivae clear, PERRLA. Moist Oral Mucosa.  5. Supple Neck, No JVD, No cervical lymphadenopathy appriciated, No Carotid Bruits.  6. Symmetrical Chest wall movement, Good air movement bilaterally, CTAB. No reproducible chest pain on palpation.  7. RRR, No Gallops, Rubs or Murmurs, No Parasternal Heave.  8. Positive Bowel Sounds, Abdomen Soft, No tenderness, No organomegaly appriciated,No rebound -guarding or rigidity.  9.  No Cyanosis, Normal Skin Turgor, No Skin Rash or Bruise.  10. Good muscle tone,  joints appear normal , no effusions, Normal ROM.  11. No Palpable Lymph Nodes in Neck or Axillae    Data Review  CBC  Recent Labs Lab 07/19/14 1303  WBC 6.5  HGB 10.2*  HCT 30.5*  PLT 232  MCV 85.4  MCH 28.6  MCHC 33.4  RDW 13.5  LYMPHSABS 0.7  MONOABS 0.5  EOSABS 0.0  BASOSABS 0.0   ------------------------------------------------------------------------------------------------------------------  Chemistries   Recent Labs Lab 07/19/14 1303  NA 133*  K 4.9  CL 95*  CO2 25  GLUCOSE 86  BUN 31*    CREATININE 1.15*  CALCIUM 9.8  AST 23  ALT 9  ALKPHOS 64  BILITOT 0.3   ------------------------------------------------------------------------------------------------------------------ CrCl is unknown because there is no height on file for the current visit. ------------------------------------------------------------------------------------------------------------------ No results found for this basename: TSH, T4TOTAL, FREET3, T3FREE, THYROIDAB,  in the last 72 hours   Coagulation profile No results found for this basename: INR, PROTIME,  in the last 168 hours ------------------------------------------------------------------------------------------------------------------- No results found for this basename: DDIMER,  in the last 72 hours -------------------------------------------------------------------------------------------------------------------  Cardiac Enzymes No results found for this basename: CK, CKMB, TROPONINI, MYOGLOBIN,  in the last 168 hours ------------------------------------------------------------------------------------------------------------------ No components found with this basename: POCBNP,    ---------------------------------------------------------------------------------------------------------------  Urinalysis No results found for this basename: colorurine, appearanceur,  labspec, phurine, glucoseu, hgbur, bilirubinur, ketonesur, proteinur, urobilinogen, nitrite, leukocytesur    ----------------------------------------------------------------------------------------------------------------  Imaging results:   Dg Chest 2 View  07/19/2014   CLINICAL DATA:  Right-sided chest pain.  EXAM: CHEST  2 VIEW  COMPARISON:  None.  FINDINGS: The heart is enlarged. There is no significant edema or effusion to suggest failure. The patient is status post median sternotomy for CABG. Emphysematous changes are present. No focal airspace disease is evident. The  visualized soft tissues and bony thorax are unremarkable.  IMPRESSION: 1. Emphysema. 2. Cardiomegaly without failure.   Electronically Signed   By: Gennette Pac M.D.   On: 07/19/2014 13:48    My personal review of EKG: Rhythm NSR, Rate  50 /min, QTc 432 , no Acute ST changes    Assessment & Plan  Principal Problem:   Chest pain Active Problems:   CAD (coronary artery disease)   HTN (hypertension)    1. chest pain: Currently chest pain free, given patient's significant cardiac history, she will be admitted for further evaluation, will keep her on when necessary nitroglycerin as needed, continue her on aspirin, she is already on beta blockers and ACE inhibitors, will monitor him on telemetry, and cycle her cardiac enzymes, patient carried a diagnoses of hyperlipidemia she is not on any statins, will check lipid panels, discussed with cardiology who will evaluate the patient to see if there is any indication for further testing. 2. History of coronary artery disease: Patient is on aspirin, ACE inhibitor, beta blockers, check lipid panel. 3. Hypertension: Controlled, continue with home medication. 4. Sinus bradycardia: Patient denies any dizziness lightheadedness or weakness, most likely related to beta blockers, would monitor him on telemetry, and if needed will decrease her beta blocker dose.   DVT Prophylaxis ,Heparin , SCDs   AM Labs Ordered, also please review Full Orders  Family Communication: Admission, patients condition and plan of care including tests being ordered have been discussed with the patient and daughter who indicate understanding and agree with the plan and Code Status.  Code Status DNR, patient has a living well, her daughter is her health care power of attorney  Likely DC to  home  Condition GUARDED    Time spent in minutes : 50 minutes.    Randol Kern, Orlandus Borowski M.D on 07/19/2014 at 5:17 PM  Between 7am to 7pm - Pager - [phone]  After 7pm go to  [email or web address] - password [email or web address]  And look for the night coverage person covering me after hours  [facility] Office  [phone]   **Disclaimer: This note may have been dictated with voice recognition software. Similar sounding words can inadvertently be transcribed and this note may contain transcription errors which may not have been corrected upon publication of note.**

## 2014-07-19 NOTE — ED Provider Notes (Signed)
Medical screening examination/treatment/procedure(s) were conducted as a shared visit with non-physician practitioner(s) and myself.  I personally evaluated the patient during the encounter.   EKG Interpretation   Date/Time:  Friday July 19 2014 12:54:03 EDT Ventricular Rate:  53 PR Interval:  206 QRS Duration: 98 QT Interval:  467 QTC Calculation: 438 R Axis:   53 Text Interpretation:  Sinus rhythm Atrial premature complex Left  ventricular hypertrophy Inferior infarct, old Non specifi ST and T wave  changes Inferior and lateral q waves No comparison available Confirmed by  Shamiah Kahler, MD, Ebone Alcivar ([id number]) on 07/19/2014 1:06:59 PM      Pt comes in with cc of chest pain. Hx of CAD, s/p CABG. Very atypical pain. Pt wants to go home, and tells me there is no pain, and that her pain was GERD. Daughter however states that intermittent, severe, midsternal chest pain today. Pt also having DOE. Will admit her for atypical chest pain.  Derwood Kaplan, MD 07/19/14 1734

## 2014-07-20 DIAGNOSIS — R079 Chest pain, unspecified: Secondary | ICD-10-CM | POA: Diagnosis not present

## 2014-07-20 DIAGNOSIS — I369 Nonrheumatic tricuspid valve disorder, unspecified: Secondary | ICD-10-CM

## 2014-07-20 DIAGNOSIS — M129 Arthropathy, unspecified: Secondary | ICD-10-CM | POA: Diagnosis not present

## 2014-07-20 DIAGNOSIS — K219 Gastro-esophageal reflux disease without esophagitis: Secondary | ICD-10-CM

## 2014-07-20 DIAGNOSIS — R071 Chest pain on breathing: Secondary | ICD-10-CM | POA: Diagnosis not present

## 2014-07-20 DIAGNOSIS — F3289 Other specified depressive episodes: Secondary | ICD-10-CM | POA: Diagnosis not present

## 2014-07-20 DIAGNOSIS — F329 Major depressive disorder, single episode, unspecified: Secondary | ICD-10-CM | POA: Diagnosis not present

## 2014-07-20 DIAGNOSIS — I10 Essential (primary) hypertension: Secondary | ICD-10-CM

## 2014-07-20 LAB — LIPID PANEL
Cholesterol: 185 mg/dL (ref 0–200)
HDL: 69 mg/dL (ref >39)
LDL Cholesterol: 105 mg/dL — ABNORMAL HIGH (ref 0–99)
TRIGLYCERIDES: 54 mg/dL (ref <150)
Total CHOL/HDL Ratio: 2.7 RATIO
VLDL: 11 mg/dL (ref 0–40)

## 2014-07-20 LAB — TROPONIN I: Troponin I: <0.3 ng/mL (ref <0.30)

## 2014-07-20 MED ORDER — ATORVASTATIN CALCIUM 10 MG PO TABS: 10.0000 mg | ORAL_TABLET | Freq: Every day | ORAL | Status: DC

## 2014-07-20 MED ORDER — LISINOPRIL 10 MG PO TABS: 10.0000 mg | ORAL_TABLET | Freq: Every day | ORAL | Status: DC

## 2014-07-20 MED ORDER — ATORVASTATIN CALCIUM 10 MG PO TABS
10.0000 mg | ORAL_TABLET | Freq: Every day | ORAL | Status: DC
  Administered 2014-07-20: 10 mg via ORAL
  Filled 2014-07-20: qty 1

## 2014-07-20 MED ORDER — PANTOPRAZOLE SODIUM 40 MG PO TBEC: 40.0000 mg | DELAYED_RELEASE_TABLET | Freq: Every day | ORAL | Status: DC

## 2014-07-20 MED ORDER — SODIUM CHLORIDE 0.9 % IV BOLUS (SEPSIS)
500.0000 mL | Freq: Once | INTRAVENOUS | Status: AC
  Administered 2014-07-20: 500 mL via INTRAVENOUS

## 2014-07-20 NOTE — Progress Notes (Signed)
    Subjective:  Denies CP or dyspnea   Objective:  Filed Vitals:   07/20/14 0000 07/20/14 0509 07/20/14 0716 07/20/14 0951  BP: 103/47 143/50 107/56 103/43  Pulse: 50 48 46 54  Temp: 98.4 F (36.9 C) 98 F (36.7 C) 98.4 F (36.9 C)   TempSrc: Oral Oral Oral   Resp: Height:      Weight:  98 lb 1.7 oz (44.5 kg)    SpO2: 97% 99% 97%     Intake/Output from previous day:  Intake/Output Summary (Last 24 hours) at 07/20/14 1020 Last data filed at 07/20/14 0815  Gross per 24 hour  Intake    195 ml  Output      0 ml  Net    195 ml    Physical Exam: Physical exam: Well-developed frail in no acute distress.  Skin is warm and dry.  HEENT is normal.  Neck is supple.  Chest is clear to auscultation with normal expansion.  Cardiovascular exam is regular rate and rhythm.  Abdominal exam nontender or distended. No masses palpated. Extremities show no edema. neuro grossly intact    Lab Results: Basic Metabolic Panel:  Recent Labs  16/10/96 1303  NA 133*  K 4.9  CL 95*  CO2 25  GLUCOSE 86  BUN 31*  CREATININE 1.15*  CALCIUM 9.8   CBC:  Recent Labs  07/19/14 1303  WBC 6.5  NEUTROABS 5.3  HGB 10.2*  HCT 30.5*  MCV 85.4  PLT 232   Cardiac Enzymes:  Recent Labs  07/19/14 2029 07/20/14 0357  TROPONINI <0.30 <0.30     Assessment/Plan:  1 chest pain-patient has ruled out. They would like conservative measures if possible. She would prefer to avoid catheterization. Continue aspirin. Continue beta blocker. Continue nitrates. Blood pressure borderline. Increase lisinopril to 10 mg daily. Ambulate today. If no chest pain she can be discharged and followup with Dr. Delton See. 2 hypertension-blood pressure has improved and is now borderline. Decrease lisinopril to 10 mg daily. Continue remaining medications. Follow as outpatient. 3 CAD-continue aspirin. Begin Lipitor 10 mg daily at discharge. Check lipids and liver in 4 weeks.  Lisa Evans 07/20/2014, 10:20 AM

## 2014-07-20 NOTE — Progress Notes (Signed)
UR completed 

## 2014-07-20 NOTE — Progress Notes (Signed)
  Echocardiogram 2D Echocardiogram has been performed.  Lisa Evans 07/20/2014, 12:48 PM

## 2014-07-20 NOTE — Progress Notes (Signed)
Pt ambulated down hall and back twice, no c/o CP,SOB, and dizziness. Will continue to monitor.

## 2014-07-20 NOTE — Progress Notes (Signed)
[facility] PROGRESS NOTE  Assessment/Plan: Chest pain: - cardiac markers negative x3, EKG Sinus rhythm, Atrial premature complex, Left ventricular hypertrophy, Non specifi ST and T wave, normal axis. - cannot increase Betablocker her HR is < 60. - echo pending.  Hyponatremia: - give bolus of NS.   Sinus bradycardia - asx. - on low dose beta blocker.  HTN (hypertension) - Bp has improved significantly. - on ACE-I added imdur and Bp lower. Will hold imdur.     Code Status: DNR Family Communication: daughter  Disposition Plan: obs   Consultants:  cardiology  Procedures:  CXR  Antibiotics:  None  HPI/Subjective: Chest pain free  Objective: Filed Vitals:   07/19/14 2134 07/20/14 0000 07/20/14 0509 07/20/14 0716  BP: 159/65 103/47 143/50 107/56  Pulse: 52 50 48 46  Temp: 97.6 F (36.4 C) 98.4 F (36.9 C) 98 F (36.7 C) 98.4 F (36.9 C)  TempSrc: Oral Oral Oral Oral  Resp: Height:      Weight:   44.5 kg (98 lb 1.7 oz)   SpO2: 98% 97% 99% 97%    Intake/Output Summary (Last 24 hours) at 07/20/14 0854 Last data filed at 07/20/14 0815  Gross per 24 hour  Intake    195 ml  Output      0 ml  Net    195 ml   Filed Weights   07/19/14 1832 07/20/14 0509  Weight: 44.6 kg (98 lb 5.2 oz) 44.5 kg (98 lb 1.7 oz)    Exam:  General: Alert, awake, oriented x3, in no acute distress.  HEENT: No bruits, no goiter.  Heart: Regular rate and rhythm. Lungs: Good air movement, clear Abdomen: Soft, nontender, nondistended, positive bowel sounds.  Neuro: Grossly intact, nonfocal.   Data Reviewed: Basic Metabolic Panel:  Recent Labs Lab 07/19/14 1303  NA 133*  K 4.9  CL 95*  CO2 25  GLUCOSE 86  BUN 31*  CREATININE 1.15*  CALCIUM 9.8   Liver Function Tests:  Recent Labs Lab 07/19/14 1303  AST 23  ALT 9  ALKPHOS 64  BILITOT 0.3  PROT 6.9  ALBUMIN 3.3*   No results found for this basename: LIPASE, AMYLASE,  in the last 168  hours No results found for this basename: AMMONIA,  in the last 168 hours CBC:  Recent Labs Lab 07/19/14 1303  WBC 6.5  NEUTROABS 5.3  HGB 10.2*  HCT 30.5*  MCV 85.4  PLT 232   Cardiac Enzymes:  Recent Labs Lab 07/19/14 2029 07/20/14 0357  TROPONINI <0.30 <0.30   BNP (last 3 results) No results found for this basename: PROBNP,  in the last 8760 hours CBG: No results found for this basename: GLUCAP,  in the last 168 hours  No results found for this or any previous visit (from the past 240 hour(s)).   Studies: Dg Chest 2 View  07/19/2014   CLINICAL DATA:  Right-sided chest pain.  EXAM: CHEST  2 VIEW  COMPARISON:  None.  FINDINGS: The heart is enlarged. There is no significant edema or effusion to suggest failure. The patient is status post median sternotomy for CABG. Emphysematous changes are present. No focal airspace disease is evident. The visualized soft tissues and bony thorax are unremarkable.  IMPRESSION: 1. Emphysema. 2. Cardiomegaly without failure.   Electronically Signed   By: Gennette Pac M.D.   On: 07/19/2014 13:48    Scheduled Meds: . acidophilus  2 capsule Oral Daily  . aspirin  325 mg Oral Daily  . Calcium Carbonate-Vitamin D  1 tablet Oral Daily  . chlorthalidone  25 mg Oral Daily  . clindamycin  300 mg Oral TID  . heparin  5,000 Units Subcutaneous 3 times per day  . isosorbide mononitrate  30 mg Oral Daily  . lisinopril  20 mg Oral Daily  . loratadine  10 mg Oral Daily  . meclizine  25 mg Oral Daily  . metoprolol succinate  25 mg Oral Daily   Continuous Infusions:    Marinda Elk  [facility] Pager [phone]. If 8PM-8AM, please contact night-coverage at [email or web address], password [facility] 07/20/2014, 8:54 AM  LOS: 1 day      **Disclaimer: This note may have been dictated with voice recognition software. Similar sounding words can inadvertently be transcribed and this note may contain transcription errors which may not have been corrected  upon publication of note.**

## 2014-07-20 NOTE — Discharge Summary (Addendum)
Physician Discharge Summary  Lisa Evans ZOX:[id number] DOB: 1922-12-11 DOA: 07/19/2014  PCP: Nicki Reaper, NP  Admit date: 07/19/2014 Discharge date: 07/20/2014  Time spent: 35 minutes  Recommendations for Outpatient Follow-up:  1. Follow up with PCP  Discharge Diagnoses:  Principal Problem:   Chest pain Active Problems:   HTN (hypertension)   CAD (coronary artery disease)   Sinus bradycardia   Discharge Condition: stable  Diet recommendation: heart healthy  Filed Weights   07/19/14 1832 07/20/14 0509  Weight: 44.6 kg (98 lb 5.2 oz) 44.5 kg (98 lb 1.7 oz)    History of present illness:  80 y.o. female, presents with complaints of chest pain, midsternal, towards the right, nonradiating, pressure like quality, reports initial episode happened after eating, this morning, as well it reoccurred upon she was trying to take a shower, and while she was walking to the car, and relieved with rest, denies any shortness of breath, diaphoresis, nausea or vomiting, palpitation or lightheadedness, patient reports she had history of coronary artery disease in the past status post CABG almost 14 years ago, reports then there was no chest pain, only presenting symptom was nausea, in ED patient first troponin was negative, her EKG did not show any significant ST or T wave abnormalities, patient was given 325 mg of aspirin by her daughter, she got her daily dose around 12:30, and when the chest pain happened she took another dose of 325 mg oral given by her daughter as well, hospitalist requested to admit for further evaluation.   Hospital Course:  Atypical Chest pain:  - cardiac markers negative x3, EKG Sinus rhythm, Atrial premature complex, Left ventricular hypertrophy, Non specifi ST and T wave, normal axis.  - cannot increase Betablocker her HR is < 60.  - echo as below. - she describes it more like GERD start protonix  Hyponatremia:  - give bolus of NS.   Sinus bradycardia  - asx.  - on  low dose beta blocker.   HTN (hypertension)  - Bp has improved significantly.  - on ACE-I added imdur and Bp lower. Will hold imdur   Procedures: Echo 9.5.2015: Systolic function was mildly reduced. The estimated ejection fraction was 50%. There is akinesis of the basal-midinferolateral myocardium. The study is not technically sufficient to allow evaluation of LV diastolic function.      Consultations:  cardiology  Discharge Exam: Filed Vitals:   07/20/14 1500  BP: 120/54  Pulse: 54  Temp:   Resp: 18    General: See progress note.  Discharge Instructions You were cared for by a hospitalist during your hospital stay. If you have any questions about your discharge medications or the care you received while you were in the hospital after you are discharged, you can call the unit and asked to speak with the hospitalist on call if the hospitalist that took care of you is not available. Once you are discharged, your primary care physician will handle any further medical issues. Please note that NO REFILLS for any discharge medications will be authorized once you are discharged, as it is imperative that you return to your primary care physician (or establish a relationship with a primary care physician if you do not have one) for your aftercare needs so that they can reassess your need for medications and monitor your lab values.  Discharge Instructions   Diet - low sodium heart healthy    Complete by:  As directed      Increase activity slowly  Complete by:  As directed           Current Discharge Medication List    START taking these medications   Details  atorvastatin (LIPITOR) 10 MG tablet Take 1 tablet (10 mg total) by mouth daily at 6 PM. Qty: 30 tablet, Refills: 0    pantoprazole (PROTONIX) 40 MG tablet Take 1 tablet (40 mg total) by mouth daily. Qty: 30 tablet, Refills: 3      CONTINUE these medications which have NOT CHANGED   Details  Artificial Tear  Ointment (REFRESH LACRI-LUBE) OINT Place 1 application into both eyes at bedtime.    aspirin 325 MG tablet Take 325 mg by mouth daily.    Calcium Carbonate-Vitamin D (CALCIUM 600/VITAMIN D) 600-400 MG-UNIT per tablet Take 1 tablet by mouth daily.     chlorthalidone (HYGROTON) 25 MG tablet Take 1 tablet (25 mg total) by mouth daily. Qty: 30 tablet, Refills: 5    clindamycin (CLEOCIN) 300 MG capsule Take 300 mg by mouth 3 (three) times daily. Starting 9/1 1 dose    lisinopril (PRINIVIL,ZESTRIL) 20 MG tablet Take 20 mg by mouth daily.    loratadine (CLARITIN) 10 MG tablet Take 10 mg by mouth daily.    meclizine (ANTIVERT) 25 MG tablet Take 25 mg by mouth daily.     metoprolol succinate (TOPROL-XL) 25 MG 24 hr tablet Take 25 mg by mouth daily.    Misc Natural Products (DAILY HERBS VISION HEALTH PO) Take 1 capsule by mouth daily.     Multiple Vitamins-Minerals (MULTIVITAMIN & MINERAL PO) Take 1 capsule by mouth daily.       Allergies  Allergen Reactions  . Penicillins Hives      The results of significant diagnostics from this hospitalization (including imaging, microbiology, ancillary and laboratory) are listed below for reference.    Significant Diagnostic Studies: Dg Chest 2 View  07/19/2014   CLINICAL DATA:  Right-sided chest pain.  EXAM: CHEST  2 VIEW  COMPARISON:  None.  FINDINGS: The heart is enlarged. There is no significant edema or effusion to suggest failure. The patient is status post median sternotomy for CABG. Emphysematous changes are present. No focal airspace disease is evident. The visualized soft tissues and bony thorax are unremarkable.  IMPRESSION: 1. Emphysema. 2. Cardiomegaly without failure.   Electronically Signed   By: Gennette Pac M.D.   On: 07/19/2014 13:48    Microbiology: No results found for this or any previous visit (from the past 240 hour(s)).   Labs: Basic Metabolic Panel:  Recent Labs Lab 07/19/14 1303  NA 133*  K 4.9  CL 95*  CO2 25   GLUCOSE 86  BUN 31*  CREATININE 1.15*  CALCIUM 9.8   Liver Function Tests:  Recent Labs Lab 07/19/14 1303  AST 23  ALT 9  ALKPHOS 64  BILITOT 0.3  PROT 6.9  ALBUMIN 3.3*   No results found for this basename: LIPASE, AMYLASE,  in the last 168 hours No results found for this basename: AMMONIA,  in the last 168 hours CBC:  Recent Labs Lab 07/19/14 1303  WBC 6.5  NEUTROABS 5.3  HGB 10.2*  HCT 30.5*  MCV 85.4  PLT 232   Cardiac Enzymes:  Recent Labs Lab 07/19/14 2029 07/20/14 0357  TROPONINI <0.30 <0.30   BNP: BNP (last 3 results) No results found for this basename: PROBNP,  in the last 8760 hours CBG: No results found for this basename: GLUCAP,  in the last 168 hours  Signed:  Marinda Elk  [facility] 07/20/2014, 5:33 PM

## 2014-08-02 ENCOUNTER — Ambulatory Visit (INDEPENDENT_AMBULATORY_CARE_PROVIDER_SITE_OTHER): Payer: Medicare Other | Admitting: Internal Medicine

## 2014-08-02 ENCOUNTER — Encounter: Payer: Self-pay | Admitting: Internal Medicine

## 2014-08-02 VITALS — BP 116/62 | HR 51 | Temp 97.8°F | Wt 103.0 lb

## 2014-08-02 DIAGNOSIS — R079 Chest pain, unspecified: Secondary | ICD-10-CM

## 2014-08-02 DIAGNOSIS — I2510 Atherosclerotic heart disease of native coronary artery without angina pectoris: Secondary | ICD-10-CM

## 2014-08-02 DIAGNOSIS — K219 Gastro-esophageal reflux disease without esophagitis: Secondary | ICD-10-CM

## 2014-08-02 NOTE — Progress Notes (Signed)
Subjective:    Patient ID: Lisa Evans, female    DOB: 1923-09-25, 80 y.o.   MRN: [id number]  HPI  Pt presents to the clinic today for hospital followup. She went to the ER 07/19/14 with acute chest pain. She did have some nonspecific changes noted on ECG. Given her history of 4 vessel CABG she was admitted for observation and further evaluation. Troponin's were negative. Chest xray was normal. She was treated for reflux with protonix. She was discharged home in the care of her daughter. Her daughter reports that she did put her on omeprazole instead of the RX for protonix because she had some of that at home. Once that runs out, she will start the protonix. She has not had any recurrent chest pain, dizziness, shortness of breath or reflux.  Review of Systems  Past Medical History  Diagnosis Date  . Arthritis   . Chicken pox   . Depression   . Frequent headaches   . GERD (gastroesophageal reflux disease)   . History of stomach ulcers   . Chronic kidney disease   . Hyperlipidemia   . Hypertension   . Urine incontinence     Current Outpatient Prescriptions  Medication Sig Dispense Refill  . Artificial Tear Ointment (REFRESH LACRI-LUBE) OINT Place 1 application into both eyes at bedtime.      Marland Kitchen aspirin 325 MG tablet Take 325 mg by mouth daily.      . Calcium Carbonate-Vitamin D (CALCIUM 600/VITAMIN D) 600-400 MG-UNIT per tablet Take 1 tablet by mouth daily.       . chlorthalidone (HYGROTON) 25 MG tablet Take 1 tablet (25 mg total) by mouth daily.  30 tablet  5  . clindamycin (CLEOCIN) 300 MG capsule Take 300 mg by mouth 3 (three) times daily. Starting 9/1 1 dose      . lisinopril (PRINIVIL,ZESTRIL) 20 MG tablet Take 20 mg by mouth daily.      Marland Kitchen loratadine (CLARITIN) 10 MG tablet Take 10 mg by mouth daily.      . meclizine (ANTIVERT) 25 MG tablet Take 25 mg by mouth daily.       . metoprolol succinate (TOPROL-XL) 25 MG 24 hr tablet Take 25 mg by mouth daily.      . Misc Natural  Products (DAILY HERBS VISION HEALTH PO) Take 1 capsule by mouth daily.       . Multiple Vitamins-Minerals (MULTIVITAMIN & MINERAL PO) Take 1 capsule by mouth daily.      Marland Kitchen omeprazole (PRILOSEC) 20 MG capsule Take 20 mg by mouth daily.      Marland Kitchen atorvastatin (LIPITOR) 10 MG tablet Take 1 tablet (10 mg total) by mouth daily at 6 PM.  30 tablet  0  . pantoprazole (PROTONIX) 40 MG tablet Take 1 tablet (40 mg total) by mouth daily.  30 tablet  3   No current facility-administered medications for this visit.    Allergies  Allergen Reactions  . Penicillins Hives    Family History  Problem Relation Age of Onset  . Heart disease Father   . Hypertension Father   . Cancer Sister     Breast  . Hyperlipidemia Sister   . Heart disease Sister   . Kidney disease Sister   . Hyperlipidemia Brother   . Heart disease Brother   . Cancer Son     Lung  . Kidney disease Son     History   Social History  . Marital Status: Widowed    Spouse  Name: N/A    Number of Children: N/A  . Years of Education: N/A   Occupational History  . Not on file.   Social History Main Topics  . Smoking status: Never Smoker   . Smokeless tobacco: Never Used  . Alcohol Use: No  . Drug Use: No  . Sexual Activity: Not Currently   Other Topics Concern  . Not on file   Social History Narrative  . No narrative on file     Constitutional: Pt reports fatigue. Denies fever, malaise, headache or abrupt weight changes.  Respiratory: Denies difficulty breathing, shortness of breath, cough or sputum production.   Cardiovascular: Denies chest pain, chest tightness, palpitations or swelling in the hands or feet.  Gastrointestinal: Denies abdominal pain, bloating, constipation, diarrhea or blood in the stool.  Neurological: Pt reports difficulty with memory. Denies dizziness, difficulty with speech or problems with balance and coordination.   No other specific complaints in a complete review of systems (except as listed  in HPI above).     Objective:   Physical Exam  BP 116/62  Pulse 51  Temp(Src) 97.8 F (36.6 C) (Oral)  Wt 103 lb (46.72 kg)  SpO2 97% Wt Readings from Last 3 Encounters:  08/02/14 103 lb (46.72 kg)  07/20/14 98 lb 1.7 oz (44.5 kg)  07/01/14 103 lb (46.72 kg)    General: Appears her stated age, well developed, well nourished in NAD. Cardiovascular: Normal rate and rhythm. S1,S2 noted.  No murmur, rubs or gallops noted.  Pulmonary/Chest: Normal effort and positive vesicular breath sounds. No respiratory distress. No wheezes, rales or ronchi noted.  Neurological: Slightly confused, but pleasant.   BMET    Component Value Date/Time   NA 133* 07/19/2014 1303   K 4.9 07/19/2014 1303   CL 95* 07/19/2014 1303   CO2 25 07/19/2014 1303   GLUCOSE 86 07/19/2014 1303   BUN 31* 07/19/2014 1303   CREATININE 1.15* 07/19/2014 1303   CALCIUM 9.8 07/19/2014 1303   GFRNONAA 41* 07/19/2014 1303   GFRAA 47* 07/19/2014 1303    Lipid Panel     Component Value Date/Time   CHOL 185 07/20/2014 0357   TRIG 54 07/20/2014 0357   HDL 69 07/20/2014 0357   CHOLHDL 2.7 07/20/2014 0357   VLDL 11 07/20/2014 0357   LDLCALC 105* 07/20/2014 0357    CBC    Component Value Date/Time   WBC 6.5 07/19/2014 1303   RBC 3.57* 07/19/2014 1303   HGB 10.2* 07/19/2014 1303   HCT 30.5* 07/19/2014 1303   PLT 232 07/19/2014 1303   MCV 85.4 07/19/2014 1303   MCH 28.6 07/19/2014 1303   MCHC 33.4 07/19/2014 1303   RDW 13.5 07/19/2014 1303   LYMPHSABS 0.7 07/19/2014 1303   MONOABS 0.5 07/19/2014 1303   EOSABS 0.0 07/19/2014 1303   BASOSABS 0.0 07/19/2014 1303    Hgb A1C No results found for this basename: HGBA1C         Assessment & Plan:   Hospital followup for chest pain, reflux:  Hospital notes, labs, imaging reviewed She is currently on prillosec without recurrent pain Advised her that if that is working for her, no need to switch to protonix I do not think there is a need for further cardiology evaluation BP controlled Wegiht stable Lipids  profile reviewed- on lipitor Continue aspirin  RTC in 6 months or sooner if needed

## 2014-08-02 NOTE — Patient Instructions (Addendum)
Gastroesophageal Reflux Disease, Adult Gastroesophageal reflux disease (GERD) happens when acid from your stomach flows up into the esophagus. When acid comes in contact with the esophagus, the acid causes soreness (inflammation) in the esophagus. Over time, GERD may create small holes (ulcers) in the lining of the esophagus. CAUSES   Increased body weight. This puts pressure on the stomach, making acid rise from the stomach into the esophagus.  Smoking. This increases acid production in the stomach.  Drinking alcohol. This causes decreased pressure in the lower esophageal sphincter (valve or ring of muscle between the esophagus and stomach), allowing acid from the stomach into the esophagus.  Late evening meals and a full stomach. This increases pressure and acid production in the stomach.  A malformed lower esophageal sphincter. Sometimes, no cause is found. SYMPTOMS   Burning pain in the lower part of the mid-chest behind the breastbone and in the mid-stomach area. This may occur twice a week or more often.  Trouble swallowing.  Sore throat.  Dry cough.  Asthma-like symptoms including chest tightness, shortness of breath, or wheezing. DIAGNOSIS  Your caregiver may be able to diagnose GERD based on your symptoms. In some cases, X-rays and other tests may be done to check for complications or to check the condition of your stomach and esophagus. TREATMENT  Your caregiver may recommend over-the-counter or prescription medicines to help decrease acid production. Ask your caregiver before starting or adding any new medicines.  HOME CARE INSTRUCTIONS   Change the factors that you can control. Ask your caregiver for guidance concerning weight loss, quitting smoking, and alcohol consumption.  Avoid foods and drinks that make your symptoms worse, such as:  Caffeine or alcoholic drinks.  Chocolate.  Peppermint or mint flavorings.  Garlic and onions.  Spicy foods.  Citrus fruits,  such as oranges, lemons, or limes.  Tomato-based foods such as sauce, chili, salsa, and pizza.  Fried and fatty foods.  Avoid lying down for the 3 hours prior to your bedtime or prior to taking a nap.  Eat small, frequent meals instead of large meals.  Wear loose-fitting clothing. Do not wear anything tight around your waist that causes pressure on your stomach.  Raise the head of your bed 6 to 8 inches with wood blocks to help you sleep. Extra pillows will not help.  Only take over-the-counter or prescription medicines for pain, discomfort, or fever as directed by your caregiver.  Do not take aspirin, ibuprofen, or other nonsteroidal anti-inflammatory drugs (NSAIDs). SEEK IMMEDIATE MEDICAL CARE IF:   You have pain in your arms, neck, jaw, teeth, or back.  Your pain increases or changes in intensity or duration.  You develop nausea, vomiting, or sweating (diaphoresis).  You develop shortness of breath, or you faint.  Your vomit is green, yellow, black, or looks like coffee grounds or blood.  Your stool is red, bloody, or black. These symptoms could be signs of other problems, such as heart disease, gastric bleeding, or esophageal bleeding. MAKE SURE YOU:   Understand these instructions.  Will watch your condition.  Will get help right away if you are not doing well or get worse. Document Released: 08/11/2005 Document Revised: 01/24/2012 Document Reviewed: 05/21/2011 ExitCare Patient Information 2015 [employer]. This information is not intended to replace advice given to you by your health care provider. Make sure you discuss any questions you have with your health care provider.  

## 2014-08-02 NOTE — Progress Notes (Signed)
Pre visit review using our clinic review tool, if applicable. No additional management support is needed unless otherwise documented below in the visit note. 

## 2014-08-28 ENCOUNTER — Other Ambulatory Visit: Payer: Self-pay

## 2014-08-28 MED ORDER — ATORVASTATIN CALCIUM 10 MG PO TABS: 10.0000 mg | ORAL_TABLET | Freq: Every day | ORAL | Status: DC

## 2014-08-28 NOTE — Telephone Encounter (Signed)
Yes stay on lipitor- please send in 90 day supply.

## 2014-08-28 NOTE — Telephone Encounter (Signed)
[name] left /vm while pt was in the hospital pt was started on atorvastatin; does Nicki Reaperegina Baity NP want pt to stay on atorvastatin? If pt is to continue atorvastatin please [facility]. [name] request cb.

## 2014-08-29 NOTE — Telephone Encounter (Signed)
[name] notified as instructed by telephone v/m.Nicki Reaperegina Baity NP [facility] to [facility] electronically on 08/28/14.

## 2014-11-03 ENCOUNTER — Other Ambulatory Visit: Payer: Self-pay | Admitting: Internal Medicine

## 2014-11-11 ENCOUNTER — Other Ambulatory Visit: Payer: Self-pay | Admitting: *Deleted

## 2014-11-11 MED ORDER — METOPROLOL SUCCINATE ER 25 MG PO TB24
25.0000 mg | ORAL_TABLET | Freq: Every day | ORAL | Status: DC
Start: 2014-11-11 — End: 2015-05-20

## 2014-11-11 MED ORDER — LISINOPRIL 20 MG PO TABS: 20.0000 mg | ORAL_TABLET | Freq: Every day | ORAL | Status: DC

## 2014-11-18 ENCOUNTER — Ambulatory Visit (INDEPENDENT_AMBULATORY_CARE_PROVIDER_SITE_OTHER): Payer: Medicare Other | Admitting: Internal Medicine

## 2014-11-18 ENCOUNTER — Telehealth: Payer: Self-pay

## 2014-11-18 ENCOUNTER — Encounter: Payer: Self-pay | Admitting: Internal Medicine

## 2014-11-18 ENCOUNTER — Ambulatory Visit (INDEPENDENT_AMBULATORY_CARE_PROVIDER_SITE_OTHER)
Admission: RE | Admit: 2014-11-18 | Discharge: 2014-11-18 | Disposition: A | Discharge status: Home / Self Care | Payer: Medicare Other | Source: Ambulatory Visit | Attending: Internal Medicine | Admitting: Internal Medicine

## 2014-11-18 VITALS — BP 136/66 | HR 55 | Temp 97.5°F | Wt 106.0 lb

## 2014-11-18 DIAGNOSIS — R531 Weakness: Secondary | ICD-10-CM

## 2014-11-18 DIAGNOSIS — R5383 Other fatigue: Secondary | ICD-10-CM

## 2014-11-18 DIAGNOSIS — T68XXXA Hypothermia, initial encounter: Secondary | ICD-10-CM

## 2014-11-18 DIAGNOSIS — R680 Hypothermia, not associated with low environmental temperature: Secondary | ICD-10-CM

## 2014-11-18 NOTE — Telephone Encounter (Signed)
Pt has appt 11/18/2014 at 4:15 with Nicki Reaper NP.

## 2014-11-18 NOTE — Telephone Encounter (Signed)
PLEASE NOTE: All timestamps contained within this report are represented as [place] Standard Time. CONFIDENTIALTY NOTICE: This fax transmission is intended only for the addressee. It contains information that is legally privileged, confidential or otherwise protected from use or disclosure. If you are not the intended recipient, you are strictly prohibited from reviewing, disclosing, copying using or disseminating any of this information or taking any action in reliance on or regarding this information. If you have received this fax in error, please notify us immediately by telephone so that we can arrange for its return to [place]. Phone: [phone], Toll-Free: [phone], Fax: [phone] Page: 1 of 2 Call Id: [id number] [facility] Day - Client TELEPHONE ADVICE RECORD [facility] Patient Name: Lisa Evans Gender: Female DOB: Mar 10, 1923 Age: 81 Y 6 M 14 D Return Phone Number: [phone] (Primary) Address: [address] City/State/Zip: [place] [address] Client [facility] Day - Client Client Site [facility] - Day Physician Nicki Reaper Contact Type Call Call Type Triage / Clinical Caller Name [name] Relationship To Patient Daughter Return Phone Number [phone] (Primary) Chief Complaint LOW BODY TEMP below 97 in anyone Initial Comment Caller states mother felt cold this weekend, shivering. Temp was 94.1, does think thermometer is off. Has her under blankets with heating pads. Has headache. Now 95.3 PreDisposition Call Doctor Nurse Assessment Nurse: Leone Haven, RN, Susie Date/Time [name] Time): 11/18/2014 8:44:42 AM Confirm and document reason for call. If symptomatic, describe symptoms. ---Caller states mother felt cold this weekend, shivering on Friday and temp was 94.1, daughter temp was 97.7; and had her under blankets with heating pads. Today the temp is 95.3 one hour ago; continues to  c/o headache and when standing she feels dizzy; feet are warm and pink; hands are warm and current temperature 97.4; Has the patient traveled out of the country within the last 30 days? ---Not Applicable Does the patient require triage? ---Yes Related visit to physician within the last 2 weeks? ---No Does the PT have any chronic conditions? (i.e. diabetes, asthma, etc.) ---Yes List chronic conditions. ---Quadruple Bypass; GERD; High Cholesterol; history of dizziness - "due to inner ear" Guidelines Guideline Title Affirmed Question Affirmed Notes Nurse Date/Time [name] Time) Cold Exposure (Hypothermia) History of hypothyroidism Wisdom, RN, Susie 11/18/2014 8:51:24 AM Disp. Time [name] Time) Disposition Final User 11/18/2014 8:43:11 AM Send to Urgent Queue Burt Ek 11/18/2014 8:56:11 AM See PCP When Office is Open (within 3 days) Yes Wisdom, RN, Susie PLEASE NOTE: All timestamps contained within this report are represented as [place] Standard Time. CONFIDENTIALTY NOTICE: This fax transmission is intended only for the addressee. It contains information that is legally privileged, confidential or otherwise protected from use or disclosure. If you are not the intended recipient, you are strictly prohibited from reviewing, disclosing, copying using or disseminating any of this information or taking any action in reliance on or regarding this information. If you have received this fax in error, please notify us immediately by telephone so that we can arrange for its return to [place]. Phone: [phone], Toll-Free: [phone], Fax: [phone] Page: 2 of 2 Call Id: [id number] Caller Understands: Yes Disagree/Comply: Comply Care Advice Given Per Guideline SEE PCP WITHIN 3 DAYS: You need to be examined within 2 or 3 days. Call your doctor during regular office hours and make an appointment. (Note: if office will be open tomorrow, tell caller to call then, not in 3 days). REASSURANCE: *  Mild hypothermia is the medical  term used to describe when a person's temperature is just below normal (95-98 F; 35-37 C). Simple measures like warm blankets and warm drinks can usually bring the temperature up to the normal range. * Shivering is the body's automatic method of making heat and warming you up. REWARMING: * Move into a warm room. WARM FLUIDS: Drink warm fluids (e.g., hot chocolate, hot milk, hot apple cider). CALL BACK IF: * Temperature under 96 F (35.5 C) rectally or under 95 F (35 C) orally after 1 hour of rewarming * Severe shivering persists over 10 minutes after rewarming and drying * You become worse. CARE ADVICE given per Cold Exposure (Adult) guideline. After Care Instructions Given Call Event Type User Date / Time Description Comments User: Helayne Seminole, RN Date/Time [name] Time): 11/18/2014 8:56:59 AM Caller advised to call MD office this morning as soon as open to schedule appointment for her mother; she verified an understanding;

## 2014-11-18 NOTE — Progress Notes (Signed)
Subjective:    Patient ID: Lisa Evans, female    DOB: Mar 28, 1923, 81 y.o.   MRN: [id number]  HPI  Pt presents to the clinic today with c/o feeling abnormally cold. She reports this started this past weekend. She reports that no matter how many blankets she put on or even a heating pad, she couldn't get warm. Her daughter checked her temperature and at one point it was 94.1. Her daughter though something was wrong with the thermometer so she checked her own temperature at it was 97.7. She has had an intermittent headache but otherwise denies pain, confusion, cough, diarrhea or urinary symptoms. Her most recent temp this morning was 95.3.  Review of Systems      Past Medical History  Diagnosis Date  . Arthritis   . Chicken pox   . Depression   . Frequent headaches   . GERD (gastroesophageal reflux disease)   . History of stomach ulcers   . Chronic kidney disease   . Hyperlipidemia   . Hypertension   . Urine incontinence     Current Outpatient Prescriptions  Medication Sig Dispense Refill  . Artificial Tear Ointment (REFRESH LACRI-LUBE) OINT Place 1 application into both eyes at bedtime.    Marland Kitchen aspirin 325 MG tablet Take 325 mg by mouth daily.    Marland Kitchen atorvastatin (LIPITOR) 10 MG tablet Take 1 tablet (10 mg total) by mouth daily at 6 PM. 90 tablet 1  . Calcium Carbonate-Vitamin D (CALCIUM 600/VITAMIN D) 600-400 MG-UNIT per tablet Take 1 tablet by mouth daily.     . chlorthalidone (HYGROTON) 25 MG tablet TAKE 1 TABLET BY MOUTH DAILY 30 tablet 2  . lisinopril (PRINIVIL,ZESTRIL) 20 MG tablet Take 1 tablet (20 mg total) by mouth daily. 30 tablet 5  . loratadine (CLARITIN) 10 MG tablet Take 10 mg by mouth daily.    . meclizine (ANTIVERT) 25 MG tablet Take 25 mg by mouth daily.     . metoprolol succinate (TOPROL-XL) 25 MG 24 hr tablet Take 1 tablet (25 mg total) by mouth daily. 30 tablet 5  . Misc Natural Products (DAILY HERBS VISION HEALTH PO) Take 1 capsule by mouth daily.     .  Multiple Vitamins-Minerals (MULTIVITAMIN & MINERAL PO) Take 1 capsule by mouth daily.    Marland Kitchen omeprazole (PRILOSEC) 20 MG capsule Take 20 mg by mouth daily.    . pantoprazole (PROTONIX) 40 MG tablet Take 1 tablet (40 mg total) by mouth daily. 30 tablet 3   No current facility-administered medications for this visit.    Allergies  Allergen Reactions  . Penicillins Hives    Family History  Problem Relation Age of Onset  . Heart disease Father   . Hypertension Father   . Cancer Sister     Breast  . Hyperlipidemia Sister   . Heart disease Sister   . Kidney disease Sister   . Hyperlipidemia Brother   . Heart disease Brother   . Cancer Son     Lung  . Kidney disease Son     History   Social History  . Marital Status: Widowed    Spouse Name: N/A    Number of Children: N/A  . Years of Education: N/A   Occupational History  . Not on file.   Social History Main Topics  . Smoking status: Never Smoker   . Smokeless tobacco: Never Used  . Alcohol Use: No  . Drug Use: No  . Sexual Activity: Not Currently   Other  Topics Concern  . Not on file   Social History Narrative     Constitutional: Pt reports low temperature, fatigue. Denies fever, malaise, headache or abrupt weight changes.  HEENT: Denies eye pain, eye redness, ear pain, ringing in the ears, wax buildup, runny nose, nasal congestion, bloody nose, or sore throat. Respiratory: Denies difficulty breathing, shortness of breath, cough or sputum production.   Cardiovascular: Denies chest pain, chest tightness, palpitations or swelling in the hands or feet.  Gastrointestinal: Denies abdominal pain, bloating, constipation, diarrhea or blood in the stool.  GU: Denies urgency, frequency, pain with urination, burning sensation, blood in urine, odor or discharge. Neurological: Denies dizziness, difficulty with memory, difficulty with speech or problems with balance and coordination.   No other specific complaints in a complete  review of systems (except as listed in HPI above).  Objective:   Physical Exam   BP 136/66 mmHg  Pulse 55  Temp(Src) 97.5 F (36.4 C) (Oral)  Wt 106 lb (48.081 kg)  SpO2 98% Wt Readings from Last 3 Encounters:  11/18/14 106 lb (48.081 kg)  08/02/14 103 lb (46.72 kg)  07/20/14 98 lb 1.7 oz (44.5 kg)    General: Appears her stated age, chronically ill appearing but in NAD. Skin: Cool but dry and intact.  HEENT: Head: normal shape and size; Eyes: sclera white, no icterus, conjunctiva pink; Ears: Tm's gray and intact, normal light reflex; Throat/Mouth: Teeth present, mucosa pink and moist, no exudate, lesions or ulcerations noted. .  Cardiovascular: Normal rate and rhythm. S1,S2 noted.  No murmur, rubs or gallops noted.  Pulmonary/Chest: Normal effort and positive vesicular breath sounds. No respiratory distress. No wheezes, rales or ronchi noted.  Abdomen: Soft and nontender. Normal bowel sounds, no bruits noted. No distention or masses noted. Liver, spleen and kidneys non palpable. Neurological: Alert and oriented.    BMET    Component Value Date/Time   NA 133* 07/19/2014 1303   K 4.9 07/19/2014 1303   CL 95* 07/19/2014 1303   CO2 25 07/19/2014 1303   GLUCOSE 86 07/19/2014 1303   BUN 31* 07/19/2014 1303   CREATININE 1.15* 07/19/2014 1303   CALCIUM 9.8 07/19/2014 1303   GFRNONAA 41* 07/19/2014 1303   GFRAA 47* 07/19/2014 1303    Lipid Panel     Component Value Date/Time   CHOL 185 07/20/2014 0357   TRIG 54 07/20/2014 0357   HDL 69 07/20/2014 0357   CHOLHDL 2.7 07/20/2014 0357   VLDL 11 07/20/2014 0357   LDLCALC 105* 07/20/2014 0357    CBC    Component Value Date/Time   WBC 6.5 07/19/2014 1303   RBC 3.57* 07/19/2014 1303   HGB 10.2* 07/19/2014 1303   HCT 30.5* 07/19/2014 1303   PLT 232 07/19/2014 1303   MCV 85.4 07/19/2014 1303   MCH 28.6 07/19/2014 1303   MCHC 33.4 07/19/2014 1303   RDW 13.5 07/19/2014 1303   LYMPHSABS 0.7 07/19/2014 1303   MONOABS 0.5  07/19/2014 1303   EOSABS 0.0 07/19/2014 1303   BASOSABS 0.0 07/19/2014 1303    Hgb A1C No results found for: HGBA1C      Assessment & Plan:  Low body temp:  Temperature normal today Urinalysis: normal Will check chest xray to make sure there is not an underlying pneumonia Will check CBC and CMET today  Will follow up with you after labs are back. For now, dress in layers, and continue to keep covered up with blankets

## 2014-11-18 NOTE — Progress Notes (Signed)
Pre visit review using our clinic review tool, if applicable. No additional management support is needed unless otherwise documented below in the visit note. 

## 2014-11-19 ENCOUNTER — Other Ambulatory Visit: Payer: Self-pay | Admitting: Internal Medicine

## 2014-11-19 DIAGNOSIS — R7989 Other specified abnormal findings of blood chemistry: Secondary | ICD-10-CM

## 2014-11-19 LAB — COMPREHENSIVE METABOLIC PANEL
ALK PHOS: 78 U/L (ref 39–117)
ALT: 14 U/L (ref 0–35)
AST: 28 U/L (ref 0–37)
Albumin: 3.7 g/dL (ref 3.5–5.2)
BUN: 40 mg/dL — ABNORMAL HIGH (ref 6–23)
CALCIUM: 9.4 mg/dL (ref 8.4–10.5)
CO2: 29 mEq/L (ref 19–32)
Chloride: 99 mEq/L (ref 96–112)
Creatinine, Ser: 1.6 mg/dL — ABNORMAL HIGH (ref 0.4–1.2)
GFR: 32.69 mL/min — ABNORMAL LOW (ref >60.00)
Glucose, Bld: 82 mg/dL (ref 70–99)
POTASSIUM: 4.5 meq/L (ref 3.5–5.1)
Sodium: 134 mEq/L — ABNORMAL LOW (ref 135–145)
TOTAL PROTEIN: 7.2 g/dL (ref 6.0–8.3)
Total Bilirubin: 0.4 mg/dL (ref 0.2–1.2)

## 2014-11-19 LAB — CBC
HCT: 30.2 % — ABNORMAL LOW (ref 36.0–46.0)
Hemoglobin: 9.9 g/dL — ABNORMAL LOW (ref 12.0–15.0)
MCHC: 32.8 g/dL (ref 30.0–36.0)
MCV: 86.6 fl (ref 78.0–100.0)
PLATELETS: 248 10*3/uL (ref 150.0–400.0)
RBC: 3.48 Mil/uL — ABNORMAL LOW (ref 3.87–5.11)
RDW: 13.7 % (ref 11.5–15.5)
WBC: 4.8 10*3/uL (ref 4.0–10.5)

## 2014-11-19 NOTE — Patient Instructions (Signed)
Hypothermia Hypothermia is low body temperature. The commonly accepted average normal body temperature is 98.6 F (37 C). When the body temperature drops below 95 F (35 C), it is a medical emergency. CAUSES  Hypothermia usually occurs when a person is exposed to low temperatures. Such exposures frequently occur when a person does not have proper shelter or clothing. This can occur when a person is stranded outside, homeless, does not have heat in his or her home, or is immersed in cold water. People with impaired mobility, babies, and the elderly are particularly susceptible to becoming hypothermic. Sometimes the body's temperature control can be altered by disease. Certain medical conditions increase a person's risk of developing hypothermia, including:  Burns.  Lasting (chronic) skin disease (psoriasis).  Head injuries.  Strokes.  Poisonings.  Parkinson's disease.  Brain tumors.  Multiple sclerosis.  Inflamed pancreas (pancreatitis).  Overwhelming infections.  Uremia.  Alcoholism.  Substance abuse.  Mental illness.  Certain medicines. SYMPTOMS   Drowsiness.  Confusion.  Slowed thinking and speech.  Slow, shallow breathing.  Slow, weak pulse.  Shivering (early in hypothermia) or no shivering (late in hypothermia).  Uncoordinated movements.  Slowed response time.  Numbness. DIAGNOSIS  Hypothermia is usually suspected when someone has been exposed to cold. It is verified by taking the person's temperature with a thermometer. The most accurate measurement is a rectal temperature. TREATMENT  Treatment begins with first aid on the scene. If hypothermia is suspected, treatment should be gentle. Vigorous handling can cause the heart to stop. Attempt first aid until it is possible to transport the person to a medical facility for further help. First aid on the scene includes:  Moving the person to a warmer location. If you cannot get the person inside, protect  him or her from the wind. Lay him or her on a cloth, blanket, sleeping bag, or even cardboard, so that he or she is not in contact with the cold ground.  Taking off the person's wet clothes. Wrap him or her in warm, dry material, such as blankets, coats, towels, sleeping bags, or clothing. If you cannot get the person to medical help right away, lie down close to the person. Sharing body heat, especially skin-to-skin contact, can help raise his or her body temperature.  Giving the person a warm drink. Do not give alcohol or caffeine.  Using first-aid warm compresses to provide heat to the neck, chest, or groin. Do not apply the compress to the arms or legs, as this can cause a fatal heart attack. Never use a heat lamp, heating pad, or hot water to apply heat directly to a person who is hypothermic. This can injure his or her skin or cause a heart attack. Once the person is taken to a medical facility, a variety of treatments can be done, including:  Giving warm fluids by intravenous (IV) access.  Giving warm, humidified oxygen through nasal tubing (nasal cannula), an oxygen mask or a breathing tube (endotracheal tube).  Rewarming the blood by removing it, passing it through a hemodialysis machine, and returning it to the body at gradually increasing temperatures.  Inserting a tiny tube into the stomach, bladder, or intestine, and instilling a warm saline solution directly into the body cavity. This is called cavity lavage. HOME CARE INSTRUCTIONS  If you have a condition that may make you prone to hypothermia, be sure to keep your home warm. When you are in a cold environment, wear layers of clothing. People lose a lot of heat  cavity lavage.  HOME CARE INSTRUCTIONS   If you have a condition that may make you prone to hypothermia, be sure to keep your home warm. When you are in a cold environment, wear layers of clothing. People lose a lot of heat from the head and hands, so always wear a hat and gloves in the cold. Find out whether any of your medicines may put you at risk for hypothermia. Do not drink alcohol if you will be in the cold. Alcohol causes your blood vessels to get larger (dilate), making you lose body  heat.  SEEK IMMEDIATE MEDICAL CARE IF:   · You start to shiver.  · Your teeth chatter.  · You become drowsy.  · You become confused or have slowed thinking and speech.  · You develop slow, shallow breathing.  · You develop a slow, weak pulse.  · You have decreased coordination.  · Your response time is slow.  Document Released: 04/21/2010 Document Revised: 01/24/2012 Document Reviewed: 04/21/2010  ExitCare® Patient Information ©2015 [employer]. This information is not intended to replace advice given to you by your health care provider. Make sure you discuss any questions you have with your health care provider.

## 2014-11-22 ENCOUNTER — Telehealth: Payer: Self-pay

## 2014-11-22 NOTE — Telephone Encounter (Signed)
Lawson FiscalLori pts daughter left v/m; pt has been using her brothers 4 wheeled walker; pts brother needs walker back and [name] request order for walker sent to [facility] [place]. [name] request cb.

## 2014-11-25 ENCOUNTER — Other Ambulatory Visit: Payer: Self-pay | Admitting: Internal Medicine

## 2014-11-25 ENCOUNTER — Telehealth: Payer: Self-pay | Admitting: Internal Medicine

## 2014-11-25 DIAGNOSIS — R2681 Unsteadiness on feet: Secondary | ICD-10-CM

## 2014-11-25 NOTE — Telephone Encounter (Signed)
RX for walker printed and signed, placed in melanies box ready to be faxed

## 2014-11-25 NOTE — Telephone Encounter (Signed)
Received call from [facility], Dr Kathrene BongoGoldsborough reviewed patients notes and rated her a 3 which means it could take 3 months to be seen. She also recommended that the patient stop her ACE medication. If her labs get worse please dont hesitate to call them back about the patient and they would re review the records. Please call the patient and advise what [facility] recommended.

## 2014-11-26 NOTE — Telephone Encounter (Signed)
Pt's daughter Lawson FiscalLori states she has already bought a walker---unless she requests in the future i will not Glass blow[employer]/designercontact [facility]

## 2014-11-26 NOTE — Telephone Encounter (Signed)
Pt's daughter came into office--pt is aware

## 2014-11-26 NOTE — Telephone Encounter (Signed)
Pt's daughter called wanting to know the status of walker order for her mother.  Apologized that we had not called her back but told her I believe order has been faxed directly to [facility].  She will call be back directly if order has not arrived.  / lt

## 2014-11-26 NOTE — Telephone Encounter (Signed)
Call pt:  Nephrologist did call me, they will not be able to see her for 3 months. For now, we will stop her clorthalidone which may be causing impaired kidney function. Will continue to monitor kidney function for now.

## 2014-12-13 ENCOUNTER — Emergency Department (HOSPITAL_COMMUNITY)
Admission: EM | Admit: 2014-12-13 | Discharge: 2014-12-13 | Disposition: A | Discharge status: Home / Self Care | Payer: Medicare Other | Attending: Emergency Medicine | Admitting: Emergency Medicine

## 2014-12-13 ENCOUNTER — Encounter (HOSPITAL_COMMUNITY): Payer: Self-pay

## 2014-12-13 ENCOUNTER — Emergency Department (HOSPITAL_COMMUNITY): Payer: Medicare Other

## 2014-12-13 DIAGNOSIS — Z79899 Other long term (current) drug therapy: Secondary | ICD-10-CM | POA: Insufficient documentation

## 2014-12-13 DIAGNOSIS — Z8659 Personal history of other mental and behavioral disorders: Secondary | ICD-10-CM | POA: Insufficient documentation

## 2014-12-13 DIAGNOSIS — M1990 Unspecified osteoarthritis, unspecified site: Secondary | ICD-10-CM | POA: Diagnosis not present

## 2014-12-13 DIAGNOSIS — N189 Chronic kidney disease, unspecified: Secondary | ICD-10-CM | POA: Insufficient documentation

## 2014-12-13 DIAGNOSIS — Y998 Other external cause status: Secondary | ICD-10-CM | POA: Insufficient documentation

## 2014-12-13 DIAGNOSIS — K219 Gastro-esophageal reflux disease without esophagitis: Secondary | ICD-10-CM | POA: Diagnosis not present

## 2014-12-13 DIAGNOSIS — W01198A Fall on same level from slipping, tripping and stumbling with subsequent striking against other object, initial encounter: Secondary | ICD-10-CM | POA: Diagnosis not present

## 2014-12-13 DIAGNOSIS — S0101XA Laceration without foreign body of scalp, initial encounter: Secondary | ICD-10-CM | POA: Insufficient documentation

## 2014-12-13 DIAGNOSIS — Z23 Encounter for immunization: Secondary | ICD-10-CM | POA: Diagnosis not present

## 2014-12-13 DIAGNOSIS — Y9389 Activity, other specified: Secondary | ICD-10-CM | POA: Insufficient documentation

## 2014-12-13 DIAGNOSIS — S0990XA Unspecified injury of head, initial encounter: Secondary | ICD-10-CM | POA: Diagnosis present

## 2014-12-13 DIAGNOSIS — I129 Hypertensive chronic kidney disease with stage 1 through stage 4 chronic kidney disease, or unspecified chronic kidney disease: Secondary | ICD-10-CM | POA: Insufficient documentation

## 2014-12-13 DIAGNOSIS — W19XXXA Unspecified fall, initial encounter: Secondary | ICD-10-CM

## 2014-12-13 DIAGNOSIS — Z880 Allergy status to penicillin: Secondary | ICD-10-CM | POA: Diagnosis not present

## 2014-12-13 DIAGNOSIS — Z7982 Long term (current) use of aspirin: Secondary | ICD-10-CM | POA: Diagnosis not present

## 2014-12-13 DIAGNOSIS — Z951 Presence of aortocoronary bypass graft: Secondary | ICD-10-CM | POA: Insufficient documentation

## 2014-12-13 DIAGNOSIS — E785 Hyperlipidemia, unspecified: Secondary | ICD-10-CM | POA: Insufficient documentation

## 2014-12-13 DIAGNOSIS — Z8619 Personal history of other infectious and parasitic diseases: Secondary | ICD-10-CM | POA: Diagnosis not present

## 2014-12-13 DIAGNOSIS — Y9289 Other specified places as the place of occurrence of the external cause: Secondary | ICD-10-CM | POA: Diagnosis not present

## 2014-12-13 MED ORDER — TETANUS-DIPHTH-ACELL PERTUSSIS 5-2.5-18.5 LF-MCG/0.5 IM SUSP
0.5000 mL | Freq: Once | INTRAMUSCULAR | Status: AC
  Administered 2014-12-13: 0.5 mL via INTRAMUSCULAR
  Filled 2014-12-13: qty 0.5

## 2014-12-13 NOTE — ED Provider Notes (Signed)
CSN: [id number]     Arrival date & time 12/13/14  1443 History   First MD Initiated Contact with Patient 12/13/14 1704     Chief Complaint  Patient presents with  . Head Injury     (Consider location/radiation/quality/duration/timing/severity/associated sxs/prior Treatment) Patient is a 81 y.o. female presenting with head injury. The history is provided by the patient. No language interpreter was used.  Head Injury Location:  Occipital Time since incident:  5 hours Mechanism of injury: fall   Pain details:    Quality:  Aching   Severity:  Mild   Timing:  Constant   Progression:  Unchanged Chronicity:  New Relieved by:  Nothing Worsened by:  Nothing tried Ineffective treatments:  None tried Associated symptoms: no blurred vision, no difficulty breathing, no focal weakness, no headaches, no loss of consciousness, no memory loss, no nausea, no neck pain and no vomiting   Risk factors: aspirin and being elderly     Past Medical History  Diagnosis Date  . Arthritis   . Chicken pox   . Depression   . Frequent headaches   . GERD (gastroesophageal reflux disease)   . History of stomach ulcers   . Chronic kidney disease   . Hyperlipidemia   . Hypertension   . Urine incontinence    Past Surgical History  Procedure Laterality Date  . Coronary artery bypass graft      x 4   Family History  Problem Relation Age of Onset  . Heart disease Father   . Hypertension Father   . Cancer Sister     Breast  . Hyperlipidemia Sister   . Heart disease Sister   . Kidney disease Sister   . Hyperlipidemia Brother   . Heart disease Brother   . Cancer Son     Lung  . Kidney disease Son    History  Substance Use Topics  . Smoking status: Never Smoker   . Smokeless tobacco: Never Used  . Alcohol Use: No   OB History    No data available     Review of Systems  Constitutional: Negative for fever and chills.  Eyes: Negative for blurred vision.  Respiratory: Negative for cough and  shortness of breath.   Gastrointestinal: Negative for nausea and vomiting.  Musculoskeletal: Negative for back pain, arthralgias and neck pain.  Neurological: Negative for focal weakness, loss of consciousness and headaches.  Psychiatric/Behavioral: Negative for memory loss.  All other systems reviewed and are negative.     Allergies  Penicillins  Home Medications   Prior to Admission medications   Medication Sig Start Date End Date Taking? Authorizing Provider  Artificial Tear Ointment (REFRESH LACRI-LUBE) OINT Place 1 application into both eyes at bedtime.    Historical Provider, MD  aspirin 325 MG tablet Take 325 mg by mouth daily.    Historical Provider, MD  atorvastatin (LIPITOR) 10 MG tablet Take 1 tablet (10 mg total) by mouth daily at 6 PM. 08/28/14   Lorre Munroe, NP  Calcium Carbonate-Vitamin D (CALCIUM 600/VITAMIN D) 600-400 MG-UNIT per tablet Take 1 tablet by mouth daily.     Historical Provider, MD  chlorthalidone (HYGROTON) 25 MG tablet TAKE 1 TABLET BY MOUTH DAILY 11/04/14   Lorre Munroe, NP  lisinopril (PRINIVIL,ZESTRIL) 20 MG tablet Take 1 tablet (20 mg total) by mouth daily. 11/11/14   Lorre Munroe, NP  loratadine (CLARITIN) 10 MG tablet Take 10 mg by mouth daily.    Historical Provider, MD  meclizine (ANTIVERT) 25 MG tablet Take 25 mg by mouth daily.     Historical Provider, MD  metoprolol succinate (TOPROL-XL) 25 MG 24 hr tablet Take 1 tablet (25 mg total) by mouth daily. 11/11/14   Lorre Munroe, NP  Misc Natural Products (DAILY HERBS VISION HEALTH PO) Take 1 capsule by mouth daily.     Historical Provider, MD  Multiple Vitamins-Minerals (MULTIVITAMIN & MINERAL PO) Take 1 capsule by mouth daily.    Historical Provider, MD  omeprazole (PRILOSEC) 20 MG capsule Take 20 mg by mouth daily.    Historical Provider, MD  pantoprazole (PROTONIX) 40 MG tablet Take 1 tablet (40 mg total) by mouth daily. 07/20/14   Marinda Elk, MD   BP 174/68 mmHg  Pulse 55   Temp(Src) 97.4 F (36.3 C) (Oral)  Resp 18  SpO2 100% Physical Exam  Constitutional: She is oriented to person, place, and time. She appears well-developed and well-nourished. No distress.  HENT:  Head: Normocephalic and atraumatic.  Small superficial 2 cm laceration to the left occipital region.  Hemostatic and well approximated.    Eyes: Pupils are equal, round, and reactive to light.  Neck: Normal range of motion.  Cardiovascular: Normal rate, regular rhythm, normal heart sounds and intact distal pulses.   Pulmonary/Chest: Effort normal. No respiratory distress. She has no wheezes. She exhibits no tenderness.  Abdominal: Soft. Bowel sounds are normal. She exhibits no distension. There is no tenderness. There is no rebound and no guarding.  Musculoskeletal:       Right hip: She exhibits normal range of motion, normal strength, no tenderness, no bony tenderness and no deformity.       Left hip: She exhibits normal range of motion, normal strength, no tenderness, no bony tenderness and no deformity.       Cervical back: She exhibits normal range of motion, no tenderness, no bony tenderness, no deformity, no pain and no spasm.       Thoracic back: She exhibits normal range of motion, no tenderness, no bony tenderness, no edema, no deformity, no pain and no spasm.       Lumbar back: She exhibits normal range of motion, no tenderness, no bony tenderness, no deformity, no pain and no spasm.  Neurological: She is alert and oriented to person, place, and time. She has normal strength. No cranial nerve deficit or sensory deficit. She exhibits normal muscle tone. Coordination and gait normal.  Strength 5/5 bilateral upper and lower extremities.  Sensation intact x4 extremities.  CN II-XII intact.    Skin: Skin is warm and dry.  Nursing note and vitals reviewed.   ED Course  Procedures (including critical care time) Labs Review Labs Reviewed - No data to display  Imaging Review Ct Head Wo  Contrast  12/13/2014   CLINICAL DATA:  89yof, tripped and fell, hit the back of head, no loc, small hematoma  EXAM: CT HEAD WITHOUT CONTRAST  TECHNIQUE: Contiguous axial images were obtained from the base of the skull through the vertex without intravenous contrast.  COMPARISON:  None.  FINDINGS: Ventricles are normal configuration. There is ventricular and sulcal enlargement reflecting moderate atrophy. No hydrocephalus.  No parenchymal masses or mass effect. No evidence of a cortical infarct. Patchy white matter hypoattenuation noted consistent with mild chronic microvascular ischemic change.  There are no extra-axial masses or abnormal fluid collections.  There is no intracranial hemorrhage.  Small left parietal scalp hematoma. No fracture. Sinuses are clear.  IMPRESSION: 1. No  acute intracranial abnormalities.  No intracranial hemorrhage. 2. No skull fracture. 3. Atrophy and mild chronic microvascular ischemic change.   Electronically Signed   By: Amie Portlandavid  Ormond M.D.   On: 12/13/2014 16:52     EKG Interpretation None      MDM   Final diagnoses:  Fall, initial encounter  Head injury, initial encounter   Patient is an 81 year old Caucasian female with pertinent past medical history of poor balance who uses a walker on a daily basis.  She comes to the emergency department today after a mechanical fall after ambulating without using her walker. Physical exam as above.  Initial workup included a CT of the head. This demonstrated no cranial hemorrhage and no fractures. Patient does have a superficial laceration to the left side of the scalp. It was not felt to require repair and is hemostatic at this time.  Patient was treated with a Tdap. She was ambulated by nursing was able to bear weight without pain. As result I doubt a hip fracture. She has no neck pain as result I doubt a C-spine fracture. Patient does not use Coumadin or Plavix as result I feel like her risk of delayed bleed is low. I feel the  patient is stable for discharge at this time. She was instructed to return to the emergency department with altered mental status, weakness, numbness, vomiting, or any other concerns.  The patient's family expressed understanding.  She was discharged in good condition. Imaging was reviewed by myself and considered in medical decision-making. Imaging was interpreted by Radiology. Care was discussed with my attending Dr. Radford PaxBeaton.      Bethann BerkshireAaron Beverlee Wilmarth, MD 12/14/14 1324  Nelia Shiobert L Beaton, MD 12/15/14 [phone]

## 2014-12-13 NOTE — ED Notes (Signed)
Pt here with daughter. Larey SeatFell backwards today and hit her head on the floor. No LOC. Pt didn't use her walker to get what she was wanting according to the daughter. Has a small hematoma to the posterior scalp. Bleeding controlled at this time. Pt is alert and oriented to her baseline at this time per her daughter.

## 2014-12-13 NOTE — ED Notes (Signed)
Ambulated Patient to Bathroom with Assistance.

## 2014-12-13 NOTE — Discharge Instructions (Signed)
Concussion  A concussion, or closed-head injury, is a brain injury caused by a direct blow to the head or by a quick and sudden movement (jolt) of the head or neck. Concussions are usually not life-threatening. Even so, the effects of a concussion can be serious. If you have had a concussion before, you are more likely to experience concussion-like symptoms after a direct blow to the head.   CAUSES  · Direct blow to the head, such as from running into another player during a soccer game, being hit in a fight, or hitting your head on a hard surface.  · A jolt of the head or neck that causes the brain to move back and forth inside the skull, such as in a car crash.  SIGNS AND SYMPTOMS  The signs of a concussion can be hard to notice. Early on, they may be missed by you, family members, and health care providers. You may look fine but act or feel differently.  Symptoms are usually temporary, but they may last for days, weeks, or even longer. Some symptoms may appear right away while others may not show up for hours or days. Every head injury is different. Symptoms include:  · Mild to moderate headaches that will not go away.  · A feeling of pressure inside your head.  · Having more trouble than usual:  ¨ Learning or remembering things you have heard.  ¨ Answering questions.  ¨ Paying attention or concentrating.  ¨ Organizing daily tasks.  ¨ Making decisions and solving problems.  · Slowness in thinking, acting or reacting, speaking, or reading.  · Getting lost or being easily confused.  · Feeling tired all the time or lacking energy (fatigued).  · Feeling drowsy.  · Sleep disturbances.  ¨ Sleeping more than usual.  ¨ Sleeping less than usual.  ¨ Trouble falling asleep.  ¨ Trouble sleeping (insomnia).  · Loss of balance or feeling lightheaded or dizzy.  · Nausea or vomiting.  · Numbness or tingling.  · Increased sensitivity to:  ¨ Sounds.  ¨ Lights.  ¨ Distractions.  · Vision problems or eyes that tire  easily.  · Diminished sense of taste or smell.  · Ringing in the ears.  · Mood changes such as feeling sad or anxious.  · Becoming easily irritated or angry for little or no reason.  · Lack of motivation.  · Seeing or hearing things other people do not see or hear (hallucinations).  DIAGNOSIS  Your health care provider can usually diagnose a concussion based on a description of your injury and symptoms. He or she will ask whether you passed out (lost consciousness) and whether you are having trouble remembering events that happened right before and during your injury.  Your evaluation might include:  · A brain scan to look for signs of injury to the brain. Even if the test shows no injury, you may still have a concussion.  · Blood tests to be sure other problems are not present.  TREATMENT  · Concussions are usually treated in an emergency department, in urgent care, or at a clinic. You may need to stay in the hospital overnight for further treatment.  · Tell your health care provider if you are taking any medicines, including prescription medicines, over-the-counter medicines, and natural remedies. Some medicines, such as blood thinners (anticoagulants) and aspirin, may increase the chance of complications. Also tell your health care provider whether you have had alcohol or are taking illegal drugs. This information   may affect treatment.  · Your health care provider will send you home with important instructions to follow.  · How fast you will recover from a concussion depends on many factors. These factors include how severe your concussion is, what part of your brain was injured, your age, and how healthy you were before the concussion.  · Most people with mild injuries recover fully. Recovery can take time. In general, recovery is slower in older persons. Also, persons who have had a concussion in the past or have other medical problems may find that it takes longer to recover from their current injury.  HOME  CARE INSTRUCTIONS  General Instructions  · Carefully follow the directions your health care provider gave you.  · Only take over-the-counter or prescription medicines for pain, discomfort, or fever as directed by your health care provider.  · Take only those medicines that your health care provider has approved.  · Do not drink alcohol until your health care provider says you are well enough to do so. Alcohol and certain other drugs may slow your recovery and can put you at risk of further injury.  · If it is harder than usual to remember things, write them down.  · If you are easily distracted, try to do one thing at a time. For example, do not try to watch TV while fixing dinner.  · Talk with family members or close friends when making important decisions.  · Keep all follow-up appointments. Repeated evaluation of your symptoms is recommended for your recovery.  · Watch your symptoms and tell others to do the same. Complications sometimes occur after a concussion. Older adults with a brain injury may have a higher risk of serious complications, such as a blood clot on the brain.  · Tell your teachers, school nurse, school counselor, coach, athletic trainer, or work manager about your injury, symptoms, and restrictions. Tell them about what you can or cannot do. They should watch for:  ¨ Increased problems with attention or concentration.  ¨ Increased difficulty remembering or learning new information.  ¨ Increased time needed to complete tasks or assignments.  ¨ Increased irritability or decreased ability to cope with stress.  ¨ Increased symptoms.  · Rest. Rest helps the brain to heal. Make sure you:  ¨ Get plenty of sleep at night. Avoid staying up late at night.  ¨ Keep the same bedtime hours on weekends and weekdays.  ¨ Rest during the day. Take daytime naps or rest breaks when you feel tired.  · Limit activities that require a lot of thought or concentration. These include:  ¨ Doing homework or job-related  work.  ¨ Watching TV.  ¨ Working on the computer.  · Avoid any situation where there is potential for another head injury (football, hockey, soccer, basketball, martial arts, downhill snow sports and horseback riding). Your condition will get worse every time you experience a concussion. You should avoid these activities until you are evaluated by the appropriate follow-up health care providers.  Returning To Your Regular Activities  You will need to return to your normal activities slowly, not all at once. You must give your body and brain enough time for recovery.  · Do not return to sports or other athletic activities until your health care provider tells you it is safe to do so.  · Ask your health care provider when you can drive, ride a bicycle, or operate heavy machinery. Your ability to react may be slower after a   brain injury. Never do these activities if you are dizzy.  · Ask your health care provider about when you can return to work or school.  Preventing Another Concussion  It is very important to avoid another brain injury, especially before you have recovered. In rare cases, another injury can lead to permanent brain damage, brain swelling, or death. The risk of this is greatest during the first 7-10 days after a head injury. Avoid injuries by:  · Wearing a seat belt when riding in a car.  · Drinking alcohol only in moderation.  · Wearing a helmet when biking, skiing, skateboarding, skating, or doing similar activities.  · Avoiding activities that could lead to a second concussion, such as contact or recreational sports, until your health care provider says it is okay.  · Taking safety measures in your home.  ¨ Remove clutter and tripping hazards from floors and stairways.  ¨ Use grab bars in bathrooms and handrails by stairs.  ¨ Place non-slip mats on floors and in bathtubs.  ¨ Improve lighting in dim areas.  SEEK MEDICAL CARE IF:  · You have increased problems paying attention or  concentrating.  · You have increased difficulty remembering or learning new information.  · You need more time to complete tasks or assignments than before.  · You have increased irritability or decreased ability to cope with stress.  · You have more symptoms than before.  Seek medical care if you have any of the following symptoms for more than 2 weeks after your injury:  · Lasting (chronic) headaches.  · Dizziness or balance problems.  · Nausea.  · Vision problems.  · Increased sensitivity to noise or light.  · Depression or mood swings.  · Anxiety or irritability.  · Memory problems.  · Difficulty concentrating or paying attention.  · Sleep problems.  · Feeling tired all the time.  SEEK IMMEDIATE MEDICAL CARE IF:  · You have severe or worsening headaches. These may be a sign of a blood clot in the brain.  · You have weakness (even if only in one hand, leg, or part of the face).  · You have numbness.  · You have decreased coordination.  · You vomit repeatedly.  · You have increased sleepiness.  · One pupil is larger than the other.  · You have convulsions.  · You have slurred speech.  · You have increased confusion. This may be a sign of a blood clot in the brain.  · You have increased restlessness, agitation, or irritability.  · You are unable to recognize people or places.  · You have neck pain.  · It is difficult to wake you up.  · You have unusual behavior changes.  · You lose consciousness.  MAKE SURE YOU:  · Understand these instructions.  · Will watch your condition.  · Will get help right away if you are not doing well or get worse.  Document Released: 01/22/2004 Document Revised: 11/06/2013 Document Reviewed: 05/24/2013  [employer]® Patient Information ©2015 [employer]. This information is not intended to replace advice given to you by your health care provider. Make sure you discuss any questions you have with your health care provider.

## 2014-12-19 ENCOUNTER — Ambulatory Visit (INDEPENDENT_AMBULATORY_CARE_PROVIDER_SITE_OTHER): Payer: Medicare Other | Admitting: Internal Medicine

## 2014-12-19 ENCOUNTER — Encounter: Payer: Self-pay | Admitting: Internal Medicine

## 2014-12-19 VITALS — BP 122/70 | HR 50 | Temp 97.5°F | Wt 108.0 lb

## 2014-12-19 DIAGNOSIS — Y92009 Unspecified place in unspecified non-institutional (private) residence as the place of occurrence of the external cause: Secondary | ICD-10-CM

## 2014-12-19 DIAGNOSIS — S0191XD Laceration without foreign body of unspecified part of head, subsequent encounter: Secondary | ICD-10-CM

## 2014-12-19 DIAGNOSIS — W19XXXD Unspecified fall, subsequent encounter: Secondary | ICD-10-CM

## 2014-12-19 DIAGNOSIS — S1190XD Unspecified open wound of unspecified part of neck, subsequent encounter: Secondary | ICD-10-CM

## 2014-12-19 DIAGNOSIS — M79602 Pain in left arm: Secondary | ICD-10-CM

## 2014-12-19 DIAGNOSIS — S1191XD Laceration without foreign body of unspecified part of neck, subsequent encounter: Principal | ICD-10-CM

## 2014-12-19 DIAGNOSIS — S0100XD Unspecified open wound of scalp, subsequent encounter: Secondary | ICD-10-CM

## 2014-12-19 MED ORDER — CEPHALEXIN 500 MG PO CAPS: 500.0000 mg | ORAL_CAPSULE | Freq: Three times a day (TID) | ORAL | Status: DC

## 2014-12-19 NOTE — Progress Notes (Deleted)
HPI  Ms. Logan Boresvans is a pleasant 81 yo female presenting for hospitalization f/u for a fall at home where she had a head injury on 1/29. Today she also c/o left arm soreness, decrease ROM, weakness and bruising, which she attributes to her fall. She has mild head soreness at site of injury on the back of the head which is scabbed and bruised. Per her daughter, she got her hair done today and the head laceration bled.  Pt reports more headaches than usual this week - lasting all day. She has been taking Tylenol PRN which has lessened head pain. Yesterday morning her left arm felt weak and she couldn't press up to pull herself out of bed like she usually does on her own, however she was able to do so today.  Pt reports more dizziness than usual. No nausea or vomiting. No neck pain or swelling.  Of note, per her daughter she doesn't seem as strong as she was prior to the fall. Needs to use walker and not ambulate on own.   Constitutional: Positive headache, fatigue, and anorexia (per daughter). Denies fever, and malaise. Skin: Pt reports head laceration, bleeding and bruising, and left arm bruises. No edema. HEENT: Denies eye pain, ear pain, ringing in the ears, or bloody nose. Respiratory: Denies difficulty breathing, shortness of breath, or cough.  Cardiovascular: Denies chest pain, chest tightness, palpitations or swelling in the hands or feet.  Gastrointestinal: Denies nausea, abdominal pain, bloating, constipation, diarrhea or blood in the stool.  Musculoskeletal: Pt reports decrease left arm strength in range of motion, difficulty with gait, muscle pain or joint pain and swelling.  Neurological: Pt reports dizziness. Denies difficulty with memory, difficulty with speech or problems with balance and coordination.   No other specific complaints in a complete review of systems (except as listed in HPI above).  General: Appears his stated age, well developed, well nourished in NAD. Skin: Warm, dry and  intact. No bruising noted. HEENT: Head: normal shape and size; Eyes: sclera white, no icterus, conjunctiva pink, PERRLA and EOMs intact; Ears: Tm's gray and intact, normal light reflex;  Cardiovascular: Normal rate and rhythm. S1,S2 noted. No murmur, rubs or gallops noted.  Pulmonary/Chest: Normal effort and positive vesicular breath sounds. No respiratory distress. No wheezes, rales or ronchi noted.  Abdomen: Soft and nontender. Normal bowel sounds, no bruits noted. No distention or masses noted. Liver, spleen and kidneys non palpable. Musculoskeletal: Normal flexion, extension and rotation of cervical spine. No pain with palpation of the cervical spine. Strength 5/5 BUE/BLE. No difficulty with gait.  Neurological: Alert and oriented. Coordination normal.  Psychiatric: Mood and affect normal. Behavior is normal. Judgment and thought content normal.     Assessment   Grip strength - 5/5; Held heavy object -

## 2014-12-19 NOTE — Patient Instructions (Signed)

## 2014-12-19 NOTE — Progress Notes (Signed)
Pre visit review using our clinic review tool, if applicable. No additional management support is needed unless otherwise documented below in the visit note. 

## 2014-12-19 NOTE — Progress Notes (Signed)
HPI  Ms. Logan Boresvans is a pleasant 81 year old female presenting for [facility] hospitalization f/u for a fall at home where she had a head injury on 1/29. CT head did not show a bleed or skull fracture. Today she also c/o left arm soreness, decrease ROM, weakness and bruising, which she attributes to her fall. She has mild head soreness at site of injury on the back of the head which is scabbed and bruised. Per her daughter, she got her hair done today and the head laceration bled a little. Pt reports more headaches than usual this week - lasting all day. She has been taking Tylenol PRN which has lessened head pain. Yesterday morning her left arm felt weak and she couldn't press up to pull herself out of bed like she usually does on her own, however she was able to do so today.  Pt reports more dizziness than usual. No head swelling, neck pain, visual or hearing changes, nausea or vomiting.   Of note, per her daughter she doesn't seem as strong as she was prior to the fall. Needs to use walker and not ambulate on own.   Past Medical History  Diagnosis Date  . Arthritis   . Chicken pox   . Depression   . Frequent headaches   . GERD (gastroesophageal reflux disease)   . History of stomach ulcers   . Chronic kidney disease   . Hyperlipidemia   . Hypertension   . Urine incontinence     Current Outpatient Prescriptions  Medication Sig Dispense Refill  . Artificial Tear Ointment (REFRESH LACRI-LUBE) OINT Place 1 application into both eyes at bedtime.    Marland Kitchen. aspirin 325 MG tablet Take 325 mg by mouth daily.    Marland Kitchen. atorvastatin (LIPITOR) 10 MG tablet Take 1 tablet (10 mg total) by mouth daily at 6 PM. 90 tablet 1  . Calcium Carbonate-Vitamin D (CALCIUM 600/VITAMIN D) 600-400 MG-UNIT per tablet Take 1 tablet by mouth daily.     Marland Kitchen. lisinopril (PRINIVIL,ZESTRIL) 20 MG tablet Take 1 tablet (20 mg total) by mouth daily. 30 tablet 5  . loratadine (CLARITIN) 10 MG tablet Take 10 mg by mouth daily.    . meclizine  (ANTIVERT) 25 MG tablet Take 25 mg by mouth daily.     . metoprolol succinate (TOPROL-XL) 25 MG 24 hr tablet Take 1 tablet (25 mg total) by mouth daily. 30 tablet 5  . Misc Natural Products (DAILY HERBS VISION HEALTH PO) Take 1 capsule by mouth daily.     . Multiple Vitamins-Minerals (MULTIVITAMIN & MINERAL PO) Take 1 capsule by mouth daily.    Marland Kitchen. omeprazole (PRILOSEC) 20 MG capsule Take 20 mg by mouth daily.    . pantoprazole (PROTONIX) 40 MG tablet Take 1 tablet (40 mg total) by mouth daily. 30 tablet 3  . cephALEXin (KEFLEX) 500 MG capsule Take 1 capsule (500 mg total) by mouth 3 (three) times daily. 30 capsule 0   No current facility-administered medications for this visit.    Allergies  Allergen Reactions  . Penicillins Hives    Family History  Problem Relation Age of Onset  . Heart disease Father   . Hypertension Father   . Cancer Sister     Breast  . Hyperlipidemia Sister   . Heart disease Sister   . Kidney disease Sister   . Hyperlipidemia Brother   . Heart disease Brother   . Cancer Son     Lung  . Kidney disease Son  History   Social History  . Marital Status: Widowed    Spouse Name: N/A    Number of Children: N/A  . Years of Education: N/A   Occupational History  . Not on file.   Social History Main Topics  . Smoking status: Never Smoker   . Smokeless tobacco: Never Used  . Alcohol Use: No  . Drug Use: No  . Sexual Activity: Not Currently   Other Topics Concern  . Not on file   Social History Narrative   Constitutional: Positive headache and fatigue. Denies fever or abrupt weight changes. Skin: Pt reports head laceration, bleeding and bruising, and left arm bruises. No edema. HEENT: Denies nasal drainage/bleeding, ear drainage/bleeding, eye pain or ear pain. Respiratory: Denies difficulty breathing, shortness of breath, or cough.  Cardiovascular: Denies chest pain, chest tightness, palpitations or swelling in the hands or feet.   Gastrointestinal: Denies nausea, abdominal pain, bloating, constipation, diarrhea or blood in the stool.  Musculoskeletal: Pt reports decrease left arm strength in range of motion, difficulty with gait, muscle pain or joint pain and swelling.  Neurological: Pt reports dizziness. Denies difficulty with memory or difficulty with speech.  No other specific complaints in a complete review of systems (except as listed in HPI above).  Objective:  BP 122/70 mmHg  Pulse 50  Temp(Src) 97.5 F (36.4 C) (Oral)  Wt 108 lb (48.988 kg)  SpO2 96%    General: Ms. Knouff is in a wheelchair holding her left arm. She appears her stated age, chronically ill appearing but in NAD. Skin: Cool, dry and intact. Head laceration: noted on upper portion of back of head. Detached scab is noted in hair surrounding laceration. Laceration size approx. 3 x 4mm in width and height. Approx. 1mm deep; dark red dried blood noted within wound. Left arm: 3 dark red and black colored bruises noted, each approx. 1 inch in size, on forearm; no bleeding or edema.  HEENT: Head: normal shape and size, tenderness around the back of head. Eyes: sclera white, no icterus, conjunctiva pink; Throat/Mouth: mucosa pink and moist, no lesions or ulcerations noted. Cardiovascular: Normal rate and rhythm. S1,S2 noted. No murmur, rubs or gallops noted.  Pulmonary/Chest: Normal effort and positive vesicular breath sounds. No respiratory distress. No wheezes, rales or ronchi noted.  Musculoskeletal: Cervical spine - normal flexion, extension and rotation of cervical spine; moderate strength, no pain with palpation. Left arm - normal flexion and extension of elbow; normal pronation and supination of wrist; able to hold arm at side with elbow flexed to 90 degrees; spread fingers and make a fist; 4/5 grip strength.  Neurological: Alert and oriented. Coordination normal.  Psychiatric: Mood and affect normal. Behavior is normal. Judgment and thought  content normal.     BMET    Component Value Date/Time   NA 134* 11/18/2014 1706   K 4.5 11/18/2014 1706   CL 99 11/18/2014 1706   CO2 29 11/18/2014 1706   GLUCOSE 82 11/18/2014 1706   BUN 40* 11/18/2014 1706   CREATININE 1.6* 11/18/2014 1706   CALCIUM 9.4 11/18/2014 1706   GFRNONAA 41* 07/19/2014 1303   GFRAA 47* 07/19/2014 1303    Lipid Panel     Component Value Date/Time   CHOL 185 07/20/2014 0357   TRIG 54 07/20/2014 0357   HDL 69 07/20/2014 0357   CHOLHDL 2.7 07/20/2014 0357   VLDL 11 07/20/2014 0357   LDLCALC 105* 07/20/2014 0357    CBC    Component Value Date/Time  WBC 4.8 11/18/2014 1706   RBC 3.48* 11/18/2014 1706   HGB 9.9* 11/18/2014 1706   HCT 30.2* 11/18/2014 1706   PLT 248.0 11/18/2014 1706   MCV 86.6 11/18/2014 1706   MCH 28.6 07/19/2014 1303   MCHC 32.8 11/18/2014 1706   RDW 13.7 11/18/2014 1706   LYMPHSABS 0.7 07/19/2014 1303   MONOABS 0.5 07/19/2014 1303   EOSABS 0.0 07/19/2014 1303   BASOSABS 0.0 07/19/2014 1303    Hgb A1C No results found for: HGBA1C   Assessment & Plan  Hospital follow up for fall at home:  Hospital notes, labs and imaging reviewed with pt  Laceration of head:  Reopening of wound; scab found in hair near laceration site. Cause for concern of infection:  eRx Keflex 500 MG for prophylaxis Do not touch head and refrain from getting hair done until wound has completely healed. Monitor for bleeding, redness, swelling, warmth or drainage around wound site. Call 911 or go to nearest ER if symptoms present or altered mental status.  Injury to left arm:  No x-ray ordered due to full ROM and strength. No lacerations or swelling noted. Bruising should improve. Continue to monitor. If weakness worsens, return to clinic for follow-up.

## 2014-12-20 ENCOUNTER — Telehealth: Payer: Self-pay | Admitting: Internal Medicine

## 2014-12-20 NOTE — Telephone Encounter (Signed)
 [facility] Client TELEPHONE ADVICE RECORD [facility] Patient Name: Lisa Evans DOB: April 28, 1923 Initial Comment Caller states her mother has a headache. Larey SeatFell and hit her head Friday; has been seen by Dr. Nurse Assessment Nurse: Roderic OvensNorth, RN, Amy Date/Time [name](Eastern Time): 12/20/2014 12:22:27 PM Confirm and document reason for call. If symptomatic, describe symptoms. ---DAUGHTER STATES THAT SHE FELL AND HIT HER HEAD ON FRIDAY. SHE WENT INTO THE ED. THE AREA ON HER HEAD HAD STOPPED BLEEDING AND IT CLOTTED. SHE DID GO AND GET HER HAIR DONE AND IT DID START BLEEDING AGAIN FROM HAVING THE HAIR WASHED. SHE SAW MD AND WAS PUT ON ANTIBIOTIC. TODAY SHE IS TALKING ABOUT HOW MUCH HER HEAD IS HURTING. SHE DID GIVE SOME TYLENOL AND IT HAS BEEN A LITTLE BIT OF IMPROVEMENT. SHE IS COMPLAINING IN PAIN WHERE THE OPENING AREA IS. HER BRUISED AREAS ON HER ARMS THAT WERE INJURED ARE HURTING MORE AS WELL TODAY. SHE IS ON A DAILY ASA. THE OPEN AREA IS ON THE MIDDLE LEFT SIDE OF HER HEAD. NO DRAINAGE FROM EARS OR NOSE. NO VOMITING. Has the patient traveled out of the country within the last 30 days? ---Not Applicable Does the patient require triage? ---Yes Related visit to physician within the last 2 weeks? ---Yes Does the PT have any chronic conditions? (i.e. diabetes, asthma, etc.) ---Yes List chronic conditions. ---CHF, STAGE 3 KIDNEY, BYPASS HEART Guidelines Guideline Title Affirmed Question Affirmed Notes Concussion (mTBI) Less Than 14 Days Ago Follow-up Call Concussion symptoms are worsening Final Disposition User Go to ED Now (or PCP triage) [place], RN, Amy Comments DAUGHTER STATES THAT SHE IS EATING RIGHT NOW. THE MOM DID STATE THAT SHE FEELS HER HEADACHE IS A LITTLE BETTER NOW. DAUGHTER STATES THAT SHE IS GOING TO WATCH HER FOR A FEW MORE MINUTES AND IF THE HEADACHE DOES NOT IMPROVE SHE WILL TAKE HER ON INTO THE ED. DID INFORM HER OF THE  DELAYED SYMPTOMS THAT COULD HAPPEN.

## 2014-12-24 ENCOUNTER — Telehealth: Payer: Self-pay | Admitting: Internal Medicine

## 2014-12-24 NOTE — Telephone Encounter (Signed)
Pt's daughter Lawson FiscalLori called, pt in discomfort with constipation.  States she will take her to ER.

## 2015-01-21 ENCOUNTER — Ambulatory Visit (INDEPENDENT_AMBULATORY_CARE_PROVIDER_SITE_OTHER)
Admission: RE | Admit: 2015-01-21 | Discharge: 2015-01-21 | Disposition: A | Discharge status: Home / Self Care | Payer: Medicare Other | Source: Ambulatory Visit | Attending: Internal Medicine | Admitting: Internal Medicine

## 2015-01-21 ENCOUNTER — Ambulatory Visit (INDEPENDENT_AMBULATORY_CARE_PROVIDER_SITE_OTHER): Payer: Medicare Other | Admitting: Internal Medicine

## 2015-01-21 ENCOUNTER — Encounter: Payer: Self-pay | Admitting: Internal Medicine

## 2015-01-21 ENCOUNTER — Other Ambulatory Visit: Payer: Self-pay | Admitting: Internal Medicine

## 2015-01-21 ENCOUNTER — Ambulatory Visit: Payer: Medicare Other | Admitting: Primary Care

## 2015-01-21 VITALS — BP 144/82 | HR 58 | Temp 98.0°F

## 2015-01-21 DIAGNOSIS — Y92099 Unspecified place in other non-institutional residence as the place of occurrence of the external cause: Secondary | ICD-10-CM

## 2015-01-21 DIAGNOSIS — W19XXXA Unspecified fall, initial encounter: Secondary | ICD-10-CM

## 2015-01-21 DIAGNOSIS — M79605 Pain in left leg: Secondary | ICD-10-CM

## 2015-01-21 DIAGNOSIS — M25562 Pain in left knee: Secondary | ICD-10-CM

## 2015-01-21 DIAGNOSIS — M25552 Pain in left hip: Secondary | ICD-10-CM

## 2015-01-21 DIAGNOSIS — Y92009 Unspecified place in unspecified non-institutional (private) residence as the place of occurrence of the external cause: Secondary | ICD-10-CM

## 2015-01-21 DIAGNOSIS — R29898 Other symptoms and signs involving the musculoskeletal system: Secondary | ICD-10-CM

## 2015-01-21 NOTE — Progress Notes (Signed)
Pre visit review using our clinic review tool, if applicable. No additional management support is needed unless otherwise documented below in the visit note. 

## 2015-01-21 NOTE — Progress Notes (Signed)
Subjective:    Patient ID: Lisa Evans, female    DOB: 17-Sep-1923, 81 y.o.   MRN: [id number]  HPI  Pt presents to the clinic today with c/o left hip and left leg pain. Her daughter noticed this about 2 weeks ago. She did experience a fall at home 2 weeks ago. She was standing up getting out of the bed and reports her left leg gave out. She fell to her knees. The pain is worse with movement. She describes the pain as sharp and stabbing. She denies numbness or tingling in the left leg. She has taken Tylenol with some relief. Of note, she did have another fall early February. She was seen 12/19/14 for that fall but did not c/o left leg/hip pain at that time.  Review of Systems      Past Medical History  Diagnosis Date  . Arthritis   . Chicken pox   . Depression   . Frequent headaches   . GERD (gastroesophageal reflux disease)   . History of stomach ulcers   . Chronic kidney disease   . Hyperlipidemia   . Hypertension   . Urine incontinence     Current Outpatient Prescriptions  Medication Sig Dispense Refill  . Artificial Tear Ointment (REFRESH LACRI-LUBE) OINT Place 1 application into both eyes at bedtime.    Marland Kitchen aspirin 325 MG tablet Take 325 mg by mouth daily.    Marland Kitchen atorvastatin (LIPITOR) 10 MG tablet Take 1 tablet (10 mg total) by mouth daily at 6 PM. 90 tablet 1  . fluticasone (FLONASE) 50 MCG/ACT nasal spray Place 2 sprays into both nostrils daily. X 2-3 times a week    . lisinopril (PRINIVIL,ZESTRIL) 20 MG tablet Take 1 tablet (20 mg total) by mouth daily. 30 tablet 5  . meclizine (ANTIVERT) 25 MG tablet Take 25 mg by mouth daily.     . metoprolol succinate (TOPROL-XL) 25 MG 24 hr tablet Take 1 tablet (25 mg total) by mouth daily. 30 tablet 5  . Multiple Vitamins-Minerals (MULTIVITAMIN & MINERAL PO) Take 1 capsule by mouth daily.    Marland Kitchen omeprazole (PRILOSEC) 20 MG capsule Take 20 mg by mouth daily.    . pantoprazole (PROTONIX) 40 MG tablet Take 1 tablet (40 mg total) by mouth  daily. (Patient not taking: Reported on 01/21/2015) 30 tablet 3   No current facility-administered medications for this visit.    Allergies  Allergen Reactions  . Penicillins Hives    Family History  Problem Relation Age of Onset  . Heart disease Father   . Hypertension Father   . Cancer Sister     Breast  . Hyperlipidemia Sister   . Heart disease Sister   . Kidney disease Sister   . Hyperlipidemia Brother   . Heart disease Brother   . Cancer Son     Lung  . Kidney disease Son     History   Social History  . Marital Status: Widowed    Spouse Name: N/A  . Number of Children: N/A  . Years of Education: N/A   Occupational History  . Not on file.   Social History Main Topics  . Smoking status: Never Smoker   . Smokeless tobacco: Never Used  . Alcohol Use: No  . Drug Use: No  . Sexual Activity: Not Currently   Other Topics Concern  . Not on file   Social History Narrative     Constitutional: Pt reports fatigue. Denies fever, malaise,  headache or abrupt weight  changes.  Respiratory: Denies difficulty breathing, shortness of breath, cough or sputum production.   Cardiovascular: Denies chest pain, chest tightness, palpitations or swelling in the hands or feet.  Musculoskeletal: Pt reports left hip pain, left leg pain. Denies muscle pain or joint pain and swelling.  Skin: Denies redness, rashes, lesions or ulcercations.  Neurological: Daughter reports increased confusion, problems with balance and coordination. Denies dizziness, difficulty with speech.   No other specific complaints in a complete review of systems (except as listed in HPI above).  Objective:   Physical Exam  BP 144/82 mmHg  Pulse 58  Temp(Src) 98 F (36.7 C) (Oral)  Wt   SpO2 97% Wt Readings from Last 3 Encounters:  12/19/14 108 lb (48.988 kg)  11/18/14 106 lb (48.081 kg)  08/02/14 103 lb (46.72 kg)    General: Appears her stated age, chronically ill appearing in NAD. Skin: Warm, dry  and intact. No rashes, lesions or ulcerations noted. Cardiovascular: Normal rate and rhythm. S1,S2 noted.  No murmur, rubs or gallops noted.  Pulmonary/Chest: Normal effort and positive vesicular breath sounds. No respiratory distress. No wheezes, rales or ronchi noted.  Musculoskeletal: No pain with palpation of the lumbar spine or paralumbar muscles. Unable to perform ROM of spine d/t unsteadiness. Pain with palpation of the left trochanter. Pain with palpation midway down the femur. Pain with palpation of the medial and lateral joint lines of the knee. Decreased internal rotation of the left hip. Pain with external rotation of the left hip. Normal flexion and extension of the left knee. Strength 2/5 LLE, 4/5 RLE. Neurological: Pt seems more confused than normal. Awake, pleasant, follows commands but occasionally does not respond appropriately.  BMET    Component Value Date/Time   NA 134* 11/18/2014 1706   K 4.5 11/18/2014 1706   CL 99 11/18/2014 1706   CO2 29 11/18/2014 1706   GLUCOSE 82 11/18/2014 1706   BUN 40* 11/18/2014 1706   CREATININE 1.6* 11/18/2014 1706   CALCIUM 9.4 11/18/2014 1706   GFRNONAA 41* 07/19/2014 1303   GFRAA 47* 07/19/2014 1303    Lipid Panel     Component Value Date/Time   CHOL 185 07/20/2014 0357   TRIG 54 07/20/2014 0357   HDL 69 07/20/2014 0357   CHOLHDL 2.7 07/20/2014 0357   VLDL 11 07/20/2014 0357   LDLCALC 105* 07/20/2014 0357    CBC    Component Value Date/Time   WBC 4.8 11/18/2014 1706   RBC 3.48* 11/18/2014 1706   HGB 9.9* 11/18/2014 1706   HCT 30.2* 11/18/2014 1706   PLT 248.0 11/18/2014 1706   MCV 86.6 11/18/2014 1706   MCH 28.6 07/19/2014 1303   MCHC 32.8 11/18/2014 1706   RDW 13.7 11/18/2014 1706   LYMPHSABS 0.7 07/19/2014 1303   MONOABS 0.5 07/19/2014 1303   EOSABS 0.0 07/19/2014 1303   BASOSABS 0.0 07/19/2014 1303    Hgb A1C No results found for: HGBA1C       Assessment & Plan:   Left hip pain, leg pain and knee pain  s/p fall at home:  Will obtain xray of left hip, leg and knee- will call you with the results Continue tylenol for pain for now  Will follow up with you after xrays are back, RTC as needed

## 2015-01-21 NOTE — Patient Instructions (Signed)

## 2015-01-23 ENCOUNTER — Other Ambulatory Visit: Payer: Self-pay | Admitting: Nephrology

## 2015-01-23 DIAGNOSIS — N183 Chronic kidney disease, stage 3 unspecified: Secondary | ICD-10-CM

## 2015-01-27 ENCOUNTER — Ambulatory Visit
Admission: RE | Admit: 2015-01-27 | Discharge: 2015-01-27 | Disposition: A | Discharge status: Home / Self Care | Payer: Medicare Other | Source: Ambulatory Visit | Attending: Nephrology | Admitting: Nephrology

## 2015-01-27 DIAGNOSIS — N183 Chronic kidney disease, stage 3 unspecified: Secondary | ICD-10-CM

## 2015-01-30 ENCOUNTER — Encounter (HOSPITAL_COMMUNITY)
Admission: RE | Admit: 2015-01-30 | Discharge: 2015-01-30 | Disposition: A | Discharge status: Home / Self Care | Payer: Medicare Other | Source: Ambulatory Visit | Attending: Nephrology | Admitting: Nephrology

## 2015-01-30 DIAGNOSIS — D509 Iron deficiency anemia, unspecified: Secondary | ICD-10-CM | POA: Insufficient documentation

## 2015-01-30 MED ORDER — SODIUM CHLORIDE 0.9 % IV SOLN
510.0000 mg | INTRAVENOUS | Status: DC
  Administered 2015-01-30: 510 mg via INTRAVENOUS
  Filled 2015-01-30: qty 17

## 2015-01-30 NOTE — Discharge Instructions (Signed)

## 2015-02-05 ENCOUNTER — Other Ambulatory Visit (HOSPITAL_COMMUNITY): Payer: Self-pay | Admitting: *Deleted

## 2015-02-06 ENCOUNTER — Encounter (HOSPITAL_COMMUNITY)
Admission: RE | Admit: 2015-02-06 | Discharge: 2015-02-06 | Disposition: A | Discharge status: Home / Self Care | Payer: Medicare Other | Source: Ambulatory Visit | Attending: Nephrology | Admitting: Nephrology

## 2015-02-06 DIAGNOSIS — D509 Iron deficiency anemia, unspecified: Secondary | ICD-10-CM | POA: Diagnosis not present

## 2015-02-06 MED ORDER — SODIUM CHLORIDE 0.9 % IV SOLN
510.0000 mg | INTRAVENOUS | Status: AC
  Administered 2015-02-06: 510 mg via INTRAVENOUS
  Filled 2015-02-06: qty 17

## 2015-02-19 DIAGNOSIS — N189 Chronic kidney disease, unspecified: Secondary | ICD-10-CM | POA: Diagnosis not present

## 2015-02-19 DIAGNOSIS — I129 Hypertensive chronic kidney disease with stage 1 through stage 4 chronic kidney disease, or unspecified chronic kidney disease: Secondary | ICD-10-CM | POA: Diagnosis not present

## 2015-02-19 DIAGNOSIS — I2510 Atherosclerotic heart disease of native coronary artery without angina pectoris: Secondary | ICD-10-CM | POA: Diagnosis not present

## 2015-02-19 DIAGNOSIS — M79605 Pain in left leg: Secondary | ICD-10-CM | POA: Diagnosis not present

## 2015-02-25 ENCOUNTER — Telehealth: Payer: Self-pay

## 2015-02-25 NOTE — Telephone Encounter (Signed)
She has medicare. Medicare requires office visit for refferals. She will need appt

## 2015-02-25 NOTE — Telephone Encounter (Signed)
Jacki ConesLaurie pts daughter left v/m; pt saw nephrologist on 02/24/15 and he suggested that pt may be having cervical headaches; Jacki ConesLaurie said PCP has to do a referral. Jacki ConesLaurie request cb with referral info. Pt last seen 01/21/15.

## 2015-02-25 NOTE — Telephone Encounter (Signed)
Pt has made appt for referral 02/27/2015

## 2015-02-27 ENCOUNTER — Encounter: Payer: Self-pay | Admitting: Internal Medicine

## 2015-02-27 ENCOUNTER — Ambulatory Visit (INDEPENDENT_AMBULATORY_CARE_PROVIDER_SITE_OTHER)
Admission: RE | Admit: 2015-02-27 | Discharge: 2015-02-27 | Disposition: A | Discharge status: Home / Self Care | Payer: Medicare Other | Source: Ambulatory Visit | Attending: Internal Medicine | Admitting: Internal Medicine

## 2015-02-27 ENCOUNTER — Ambulatory Visit (INDEPENDENT_AMBULATORY_CARE_PROVIDER_SITE_OTHER): Payer: Medicare Other | Admitting: Internal Medicine

## 2015-02-27 VITALS — BP 106/64 | HR 52 | Temp 97.7°F | Wt 103.0 lb

## 2015-02-27 DIAGNOSIS — R519 Headache, unspecified: Secondary | ICD-10-CM

## 2015-02-27 DIAGNOSIS — R51 Headache: Secondary | ICD-10-CM | POA: Diagnosis not present

## 2015-02-27 DIAGNOSIS — J309 Allergic rhinitis, unspecified: Secondary | ICD-10-CM

## 2015-02-27 NOTE — Patient Instructions (Signed)
Spinal Headache °A spinal headache is a severe headache that can happen after getting a spinal tap, also called lumbar puncture, or an epidural anesthetic. Both of these procedures involve passing a needle through ligaments that run along the back side of your spinal column and into one of the spaces just above your spinal cord. Sometimes spinal fluid leaks through the temporary hole left by the needle. This leak causes a decrease in spinal fluid pressure, which leads to a spinal headache. The headache usually begins within hours or 1-2 days after the procedure, and it lasts until adequate pressure returns as your body creates more spinal fluid. The headache can last a few days and rarely lasts for more than 1 week. °SIGNS AND SYMPTOMS  °· Severe headache pain when sitting or standing. °· Decreased headache pain when lying down. °· Neck pain, especially when flexing the neck in a chin-to-chest position. °· Vomiting. °DIAGNOSIS  °Diagnosis of spinal headache is usually made based on your recent medical history. Your health care provider will consider the timing of a recent spinal tap or epidural anesthetic, along with how soon your headache occurred afterward. On rare occasions, tests may be done to confirm the diagnosis, such as an MRI. °TREATMENT  °Treatment may include: °· Drinking extra fluids to improve your level of hydration. This will help your body replace the spinal fluid that has leaked out through the needle hole. Receiving IV fluids may be necessary. °· Taking pain medicine as prescribed by your health care provider. °· Drinking caffeinated beverages such as soda, coffee, or tea. Caffeine may help to shrink the blood vessels in your brain, which may reduce your headache pain. °· Lying flat for a few days. °· Having a blood patch procedure, which involves injecting a small amount of your blood at the puncture site to seal the leak. °HOME CARE INSTRUCTIONS °· Lie down to relieve pain if your pain gets  worse when you sit or stand. °· Drink enough fluids to keep your urine clear or pale yellow. °· Take pain medicine as directed by your health care provider. °SEEK IMMEDIATE MEDICAL CARE IF:  °· Your pain becomes very severe or cannot be controlled. °· You develop a fever. °· You have a stiff neck. °· You lose bowel or bladder control. °· You have trouble walking. °MAKE SURE YOU: °· Understand these instructions. °· Will watch your condition.   °· Will get help right away if you are not doing well or get worse. °Document Released: 04/23/2002 Document Revised: 11/06/2013 Document Reviewed: 05/24/2013 °[employer]® Patient Information ©2015 [employer]. This information is not intended to replace advice given to you by your health care provider. Make sure you discuss any questions you have with your health care provider. ° °

## 2015-02-27 NOTE — Progress Notes (Signed)
Subjective:    Patient ID: Lisa Evans, female    DOB: 1923/03/12, 81 y.o.   MRN: [id number]  HPI  Pt presents to the clinic today to discuss getting a referral to a neurologist. Her daughter reports her nephrologist told her that she may be having cervical headaches. Her headaches have been chronic, associated with dizziness and sensitivity to light. The pain is worse in her forehead. They seem to be worse during allergy season. She currently has a runny nose and sore throat. She takes Flonase occasionally but nothing consistently. She did have a fall back in February in which she hit her head. She did have a negative head CT right after the fall. She does not feel like her headaches have been worse that time.  Review of Systems      Past Medical History  Diagnosis Date  . Arthritis   . Chicken pox   . Depression   . Frequent headaches   . GERD (gastroesophageal reflux disease)   . History of stomach ulcers   . Chronic kidney disease   . Hyperlipidemia   . Hypertension   . Urine incontinence     Current Outpatient Prescriptions  Medication Sig Dispense Refill  . Artificial Tear Ointment (REFRESH LACRI-LUBE) OINT Place 1 application into both eyes at bedtime.    Marland Kitchen aspirin 325 MG tablet Take 325 mg by mouth daily.    Marland Kitchen atorvastatin (LIPITOR) 10 MG tablet Take 1 tablet (10 mg total) by mouth daily at 6 PM. 90 tablet 1  . fluticasone (FLONASE) 50 MCG/ACT nasal spray Place 2 sprays into both nostrils daily. X 2-3 times a week    . lisinopril (PRINIVIL,ZESTRIL) 20 MG tablet Take 1 tablet (20 mg total) by mouth daily. 30 tablet 5  . meclizine (ANTIVERT) 25 MG tablet Take 25 mg by mouth daily.     . metoprolol succinate (TOPROL-XL) 25 MG 24 hr tablet Take 1 tablet (25 mg total) by mouth daily. 30 tablet 5  . Multiple Vitamins-Minerals (MULTIVITAMIN & MINERAL PO) Take 1 capsule by mouth daily.    Marland Kitchen omeprazole (PRILOSEC) 20 MG capsule Take 20 mg by mouth daily.    . pantoprazole  (PROTONIX) 40 MG tablet Take 1 tablet (40 mg total) by mouth daily. 30 tablet 3   No current facility-administered medications for this visit.    Allergies  Allergen Reactions  . Penicillins Hives    Family History  Problem Relation Age of Onset  . Heart disease Father   . Hypertension Father   . Cancer Sister     Breast  . Hyperlipidemia Sister   . Heart disease Sister   . Kidney disease Sister   . Hyperlipidemia Brother   . Heart disease Brother   . Cancer Son     Lung  . Kidney disease Son     History   Social History  . Marital Status: Widowed    Spouse Name: N/A  . Number of Children: N/A  . Years of Education: N/A   Occupational History  . Not on file.   Social History Main Topics  . Smoking status: Never Smoker   . Smokeless tobacco: Never Used  . Alcohol Use: No  . Drug Use: No  . Sexual Activity: Not Currently   Other Topics Concern  . Not on file   Social History Narrative     Constitutional: Pt reports headache. Denies fever, malaise, fatigue, or abrupt weight changes.  HEENT: Pt reports runny nose and  sore throat. Denies eye pain, eye redness, ear pain, ringing in the ears, wax buildup, nasal congestion, bloody nose. Respiratory: Denies difficulty breathing, shortness of breath, cough or sputum production.   Cardiovascular: Denies chest pain, chest tightness, palpitations or swelling in the hands or feet.  Musculoskeletal: Denies muscle pain or joint pain and swelling.  Skin: Denies redness, rashes, lesions or ulcercations.  Neurological: Pt reports dizziness. Denies difficulty with memory, difficulty with speech.   No other specific complaints in a complete review of systems (except as listed in HPI above).  Objective:   Physical Exam   BP 106/64 mmHg  Pulse 52  Temp(Src) 97.7 F (36.5 C) (Oral)  Wt 103 lb (46.72 kg)  SpO2 97% Wt Readings from Last 3 Encounters:  02/27/15 103 lb (46.72 kg)  01/30/15 106 lb (48.081 kg)  12/19/14  108 lb (48.988 kg)    General: Appears her stated age, chronically ill appearing in NAD. Skin: Warm, dry and intact.  HEENT: Head: normal shape and size; Eyes: sclera white, no icterus, conjunctiva pink, PERRLA and EOMs intact;  Nose: mucosa boggy and moist, septum midline; Throat/Mouth: Teeth present, + PND, mucosa pink and moist, no exudate, lesions or ulcerations noted.  Neck: No adenopathy noted. Cardiovascular: Normal rate and rhythm. S1,S2 noted.  No murmur, rubs or gallops noted.  Pulmonary/Chest: Normal effort and positive vesicular breath sounds. No respiratory distress. No wheezes, rales or ronchi noted.  Musculoskeletal: Normal flexion and extension of cervical spine. Limited rotation. No pain with palpation of the cervical spine. Neurological: Alert and oriented.    BMET    Component Value Date/Time   NA 134* 11/18/2014 1706   K 4.5 11/18/2014 1706   CL 99 11/18/2014 1706   CO2 29 11/18/2014 1706   GLUCOSE 82 11/18/2014 1706   BUN 40* 11/18/2014 1706   CREATININE 1.6* 11/18/2014 1706   CALCIUM 9.4 11/18/2014 1706   GFRNONAA 41* 07/19/2014 1303   GFRAA 47* 07/19/2014 1303    Lipid Panel     Component Value Date/Time   CHOL 185 07/20/2014 0357   TRIG 54 07/20/2014 0357   HDL 69 07/20/2014 0357   CHOLHDL 2.7 07/20/2014 0357   VLDL 11 07/20/2014 0357   LDLCALC 105* 07/20/2014 0357    CBC    Component Value Date/Time   WBC 4.8 11/18/2014 1706   RBC 3.48* 11/18/2014 1706   HGB 9.9* 11/18/2014 1706   HCT 30.2* 11/18/2014 1706   PLT 248.0 11/18/2014 1706   MCV 86.6 11/18/2014 1706   MCH 28.6 07/19/2014 1303   MCHC 32.8 11/18/2014 1706   RDW 13.7 11/18/2014 1706   LYMPHSABS 0.7 07/19/2014 1303   MONOABS 0.5 07/19/2014 1303   EOSABS 0.0 07/19/2014 1303   BASOSABS 0.0 07/19/2014 1303    Hgb A1C No results found for: HGBA1C      Assessment & Plan:   Frequent headaches:  Worsened by allergies- advised her to start Claritin daily at night Take her  Flonase daily x 2 weeks Daughter is concerned about possibility of cervical headaches Will obtain xray of cervical spine today Ok to take Tylenol as needed for headache Will refer to Neurology for further evaluation  RTC as needed or if symptoms persist or worsen

## 2015-02-27 NOTE — Progress Notes (Signed)
Pre visit review using our clinic review tool, if applicable. No additional management support is needed unless otherwise documented below in the visit note. 

## 2015-03-03 ENCOUNTER — Ambulatory Visit (INDEPENDENT_AMBULATORY_CARE_PROVIDER_SITE_OTHER): Payer: Medicare Other | Admitting: Neurology

## 2015-03-03 ENCOUNTER — Encounter: Payer: Self-pay | Admitting: Neurology

## 2015-03-03 VITALS — BP 100/64 | HR 60 | Ht 60.0 in | Wt 105.1 lb

## 2015-03-03 DIAGNOSIS — G20 Parkinson's disease: Secondary | ICD-10-CM | POA: Diagnosis not present

## 2015-03-03 DIAGNOSIS — R519 Headache, unspecified: Secondary | ICD-10-CM

## 2015-03-03 DIAGNOSIS — R269 Unspecified abnormalities of gait and mobility: Secondary | ICD-10-CM

## 2015-03-03 DIAGNOSIS — M79602 Pain in left arm: Secondary | ICD-10-CM

## 2015-03-03 DIAGNOSIS — R51 Headache: Secondary | ICD-10-CM | POA: Diagnosis not present

## 2015-03-03 LAB — SEDIMENTATION RATE: SED RATE: 9 mm/h (ref 0–30)

## 2015-03-03 LAB — C-REACTIVE PROTEIN: CRP: <0.5 mg/dL (ref <0.60)

## 2015-03-03 LAB — VITAMIN B12: VITAMIN B 12: 528 pg/mL (ref 211–911)

## 2015-03-03 LAB — TSH: TSH: 1.322 u[IU]/mL (ref 0.350–4.500)

## 2015-03-03 MED ORDER — NORTRIPTYLINE HCL 10 MG PO CAPS: 10.0000 mg | ORAL_CAPSULE | Freq: Every day | ORAL | Status: DC

## 2015-03-03 NOTE — Progress Notes (Signed)
[facility] Clinic Note - Initial Visit   Date: 03/03/2015   Lisa Evans MRN: [id number] DOB: 05-Feb-1923   Dear Webb Silversmith, NP:  Thank you for your kind referral of Lisa Evans for consultation of headaches. Although her history is well known to you, please allow [place] to reiterate it for the purpose of our medical record. The patient was accompanied to the clinic by daughter who also provides collateral information.     History of Present Illness: Lisa Evans is a 81 y.o. right-handed Caucasian female with CAD with MI (1999) s/p CABG, hypertension, hyperlipidemia, depression, and GERD presenting for evaluation of headaches.    For many years, she has always had problems with headaches and balance, which she attributed to sinus problems.  In late January 2016, she was turning to put tea in the refrigerator and lost her balance and fell to the ground.  He hit the back of her headache and sustained a superficial laceration. There was no loss of consciousness.  She was unable to get get up by herself and waited for her granddaughter to help her up.  When her daughter arrived a few minutes later, she took patient to the emergency department.  CT head did not show any acute abnormalities.  Unfortunately, daughter tells me that the laceration was not cleaned or sutered and the following week, when she went to get her hair washed at the salon, the scab came off and bleeding restarted.  She followed-up with her PCP later that afternoon who started antibiotics.  Since the, her laceration has healed.  Headaches have become more severe since her fall.  They are bifrontal and aches constantly.  She is taking up to four tylenol per day, which provides minimal relief.  She has associated nausea, photophobia, and phonophobia.    She also complains of vision problems, which started in February 2015.  She woke up one morning with vision loss in her right eye and was told she had a  corneal abrasion.  She has seen four different eye doctors at [facility] without a clear explanation for her vision loss.  She has a long history of balance, but has only been using a cane and walker over the past.  She falls about once per week, without any significant injuries.  Denies numbness or tingling of the legs.  She endorses problems with swallowing solids and is on soft diet, changes in her sense of smell, writing changes, slowed movements, and and constipation.  Denies any restless leg symptoms, vivid dreams,   She complains of left arm pain which started this morning, which hurts with movement.  She denies any injury to the arm.  There is pain with palpation of the left upper arm.  No electrical, shooting pain.   Out-side paper records, electronic medical record, and images have been reviewed where available and summarized as:  CT head wo contrast 12/13/2014: IMPRESSION: 1. No acute intracranial abnormalities. No intracranial hemorrhage. 2. No skull fracture.  MRA neck 10/03/2010: 1.  Bilateral atherosclerosis disease involving origins of the internal carotid arteries in the neck but without hemodynamically significant stenosis identfied.  2.  There is a high grade short segment stenosis at the origin of the left vertebral artery.  Both vertebral arteries are patent. 3. Atrophy and mild chronic microvascular ischemic change.   Past Medical History  Diagnosis Date  . Arthritis   . Chicken pox   . Depression   . Frequent headaches   . GERD (gastroesophageal  reflux disease)   . History of stomach ulcers   . Chronic kidney disease   . Hyperlipidemia   . Hypertension   . Urine incontinence     Past Surgical History  Procedure Laterality Date  . Coronary artery bypass graft      x 4     Medications:  Current Outpatient Prescriptions on File Prior to Visit  Medication Sig Dispense Refill  . Artificial Tear Ointment (REFRESH LACRI-LUBE) OINT Place 1 application into both  eyes at bedtime.    Marland Kitchen aspirin 325 MG tablet Take 325 mg by mouth daily.    Marland Kitchen atorvastatin (LIPITOR) 10 MG tablet Take 1 tablet (10 mg total) by mouth daily at 6 PM. 90 tablet 1  . fluticasone (FLONASE) 50 MCG/ACT nasal spray Place 2 sprays into both nostrils daily. X 2-3 times a week    . lisinopril (PRINIVIL,ZESTRIL) 20 MG tablet Take 1 tablet (20 mg total) by mouth daily. 30 tablet 5  . meclizine (ANTIVERT) 25 MG tablet Take 25 mg by mouth daily.     . metoprolol succinate (TOPROL-XL) 25 MG 24 hr tablet Take 1 tablet (25 mg total) by mouth daily. 30 tablet 5  . Multiple Vitamins-Minerals (MULTIVITAMIN & MINERAL PO) Take 1 capsule by mouth daily.    Marland Kitchen omeprazole (PRILOSEC) 20 MG capsule Take 20 mg by mouth daily.    . pantoprazole (PROTONIX) 40 MG tablet Take 1 tablet (40 mg total) by mouth daily. 30 tablet 3   No current facility-administered medications on file prior to visit.    Allergies:  Allergies  Allergen Reactions  . Penicillins Hives    Family History: Family History  Problem Relation Age of Onset  . Heart disease Father   . Hypertension Father   . Cancer Sister     Breast  . Hyperlipidemia Sister   . Heart disease Sister   . Kidney disease Sister   . Hyperlipidemia Brother   . Heart disease Brother   . Cancer Son     Lung  . Kidney disease Son     Social History: History   Social History  . Marital Status: Widowed    Spouse Name: N/A  . Number of Children: N/A  . Years of Education: N/A   Occupational History  . Not on file.   Social History Main Topics  . Smoking status: Never Smoker   . Smokeless tobacco: Never Used  . Alcohol Use: No  . Drug Use: No  . Sexual Activity: Not Currently   Other Topics Concern  . Not on file   Social History Narrative   Lives with daughter.   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   Highest level of education:  8th grade       Review of Systems:  CONSTITUTIONAL: No fevers, chills, night sweats, or weight  loss.   EYES: No visual changes or eye pain ENT: No hearing changes.  No history of nose bleeds.   RESPIRATORY: No cough, wheezing and shortness of breath.   CARDIOVASCULAR: Negative for chest pain, and palpitations.   GI: Negative for abdominal discomfort, blood in stools or black stools.  No recent change in bowel habits.   GU:  No history of incontinence.   MUSCLOSKELETAL: No history of joint pain or swelling.  No myalgias.   SKIN: Negative for lesions, rash, and itching.   HEMATOLOGY/ONCOLOGY: Negative for prolonged bleeding, bruising easily, and swollen nodes.  No history of cancer.   ENDOCRINE: Negative for  cold or heat intolerance, polydipsia or goiter.   PSYCH:  +depression or anxiety symptoms.   NEURO: As Above.   Vital Signs:  BP 100/64 mmHg  Pulse 60  Ht 5' (1.524 m)  Wt 105 lb 2 oz (47.684 kg)  BMI 20.53 kg/m2  SpO2 95%   General Medical Exam:   General:  Well appearing, comfortable.   Eyes/ENT: see cranial nerve examination.   Neck: No masses appreciated.  Full range of motion without tenderness.  No carotid bruits. Respiratory:  Clear to auscultation, good air entry bilaterally.   Cardiac:  Regular rate and rhythm, no murmur.   Extremities:  No deformities, edema, or skin discoloration.  Skin:  No rashes or lesions.  Neurological Exam: MENTAL STATUS including orientation to time, place, person, recent and remote memory, attention span and concentration, language, and fund of knowledge is fairly intact.  Speech is not dysarthric.  CRANIAL NERVES: II:  No visual field defects.  Limited fundoscopic exam due to small pupils.   III-IV-VI: Pupils equal round and reactive to light.  Normal conjugate, extra-ocular eye movements in all directions of gaze.  No nystagmus.  No ptosis V:  Normal facial sensation.  VII:  Normal facial symmetry and movements.   VIII:  Normal hearing and vestibular function.   IX-X:  Normal palatal movement.   XI:  Normal shoulder shrug and  head rotation.   XII:  Normal tongue strength and range of motion, no deviation or fasciculation.  MOTOR: Motor strength is 4/5 throughout, including proximal and distal muscles.  There is generalized loss of muscle bulk throughout. No pronator drift.  Tone is normal.  There is tenderness to her left biceps with palpation, but motor strength is preserved.  MSRs:  Right                                                                 Left brachioradialis 2+  brachioradialis 2+  biceps 2+  biceps 2+  triceps 2+  triceps 2+  patellar tr  patellar tr  ankle jerk 0  ankle jerk 0  Hoffman no  Hoffman no  plantar response down  plantar response down   SENSORY:  Vibration absent distal to ankles bilaterally.  Light touch intact.  COORDINATION/GAIT: Normal finger-to- nose-finger.  Severely reduced amplitude and rate of finger and toe tapping.  She is unable to rise from a chair without using arms.  Stooped posture, small steps with shuffling pattern and evidence of unsteadiness, even with one-person assist.   IMPRESSION: 1.  New onset bifrontal headaches, worsened since fall in January 2016  - Need to exclude subdural hematoma or other structural etiology 2.  Gait abnormality possibly due to parkinsonism - features of bradykinesia, blunted affect, severe shuffling gait.  No tremor or rigidity.   3.  Left upper arm pain, likely musculoskeletal 4.  Right vision changes (2015), ?old retinal artery occlusion, less likely temporal arteritis, but will check ESR  PLAN/RECOMMENDATIONS:  1.  Check TSH, vitamin B12, ESR, CRP, copper, ceruloplasmin  2.  Start nortriptyline 88m at bedtime 3.  MRI brain, MRA head and neck 4.  Consider adding sinemet to see if this helps with walking and balance going forward 5.  Follow-up with PCP for left arm pain, if  no improvement with ice 6.  Fall precautions discussed, encouraged to always use a walker/wheelchair 7.  Return to clinic in 1 month    The duration  of this appointment visit was 60 minutes of face-to-face time with the patient.  Greater than 50% of this time was spent in counseling, explanation of diagnosis, planning of further management, and coordination of care.   Thank you for allowing me to participate in patient's care.  If I can answer any additional questions, I would be pleased to do so.    Sincerely,    Donika K. Posey Pronto, DO

## 2015-03-03 NOTE — Patient Instructions (Addendum)
1.  Start nortriptyline 10mg  at bedtime 2.  Check blood work 3.  MRI brain, MRA head and neck 4.  Consider adding sinemet to see if this helps with walking and balance going forward 5.  Follow-up with PCP for left arm pain, if no improvement with ice 6.  Please use your walker at all times, as you are very unsteady when walking 7.  Return to clinic in 1 month  [facility]  Preventing Falls in the Home   Falls are common, often dreaded events in the lives of older people. Aside from the obvious injuries and even death that may result, falls can cause wide-ranging consequences including loss of independence, mental decline, decreased activity, and mobility. Younger people are also at risk of falling, especially those with chronic illnesses and fatigue.  Ways to reduce the risk for falling:  * Examine diet and medications. Warm foods and alcohol dilate blood vessels, which can lead to dizziness when standing. Sleep aids, antidepressants, and pain medications can also increase the likelihood of a fall.  * Get a vison exam. Poor vision, cataracts, and glaucoma increase the chances of falling.  * Check foot gear. Shoes should fit snugly and have a sturdy, nonskid sole and broad, low heel.  * Participate in a physician-approved exercise program to build and maintain muscle strength and improve balance and coordination.  * Increase vitamin D intake. Vitamin D improves muscle strength and increases the amount of calcium the body is able to absorb and deposit in bones.  How to prevent falls from common hazards:  * Floors - Remove all loose wires, cords, and throw rugs. Minimize clutter. Make sure rugs are anchored and smooth. Keep furniture in its usual place.  * Chairs - Use chairs with straight backs, armrests, and firm seats. Add firm cushions to existing pieces to add height.  * Bathroom - Install grab bars and non-skid tape in the tub or shower. Use a bathtub transfer bench or a shower chair  with a back support. Use an elevated toilet seat and/or safety rails to assist standing from a low surface. Do not use towel racks or bathroom tissue holders to help you stand.  * Lighting - Make sure halls, stairways, and entrances are well-lit. Install a night light in your bathroom or hallway. Make sure there is a light switch at the top and bottom of the staircase. Turn lights on if you get up in the middle of the night. Make sure lamps or light switches are within reach of the bed if you have to get up during the night.  * Kitchen - Install non-skid rubber mats near the sink and stove. Clean spills immediately. Store frequently used utensils, pots, and pans between waist and eye level. This helps prevent reaching and bending. Sit when getting things out of the lower cupboards.  * Living room / Bedrooms - Place furniture with wide spaces in between, giving enough room to move around. Establish a route through the living room that gives you something to hold onto as you walk.  * Stairs - Make sure treads, rails, and rugs are secure. Install a rail on both sides of the stairs. If stairs are a threat, it might be helpful to arrange most of your activities on the lower level to reduce the number of times you must climb the stairs.  * Entrances and doorways - Install metal handles on the walls adjacent to the doorknobs of all doors to make it more secure  as you travel through the doorway.  Tips for maintaining balance:  * Keep at least one hand free at all times Try using a backpack or fanny pack to hold things rather than carrying them in your hands. Never carry objects in both hands when walking as this interferes with keeping your balance.  * Attempt to swing both arms from front to back while walking. This might require a conscious effort if Parkinson's disease has diminished your movement. It will, however, help you to maintain balance and posture, and reduce fatigue.  * Consciously lift your feet off  the ground when walking. Shuffling and dragging of the feet is a common culprit in losing your balance.  * When trying to navigate turns, use a "U" technique of facing forward and making a wide turn, rather than pivoting sharply.  * Try to stand with your feet shoulder-length apart. When your feet are close together for any length of time, you increase your risk of losing your balance and falling.  * Do one thing at a time. Do not try to walk and accomplish another task, such as reading or looking around. The decrease in your automatic reflexes complicates motor function, so the less distraction, the better.  * Do not wear rubber or gripping soled shoes, they might "catch" on the floor and cause tripping.  * Move slowly when changing positions. Use deliberate, concentrated movements and, if needed, use a grab bar or walking aid. Count fifteen (15) seconds after standing to begin walking.  * If balance is a continuous problem, you might want to consider a walking aid such as a cane, walking stick, or walker. Once you have mastered walking with help, you may be ready to try it again on your own.  This information is provided by [facility] and is not intended to replace the medical advice of your physician or other health care providers. Please consult your physician or other health care providers for advice regarding your specific medical condition.

## 2015-03-05 LAB — CERULOPLASMIN: CERULOPLASMIN: 37 mg/dL (ref 18–53)

## 2015-03-05 LAB — COPPER, SERUM: Copper: 158 ug/dL (ref 70–175)

## 2015-03-06 ENCOUNTER — Ambulatory Visit
Admission: RE | Admit: 2015-03-06 | Discharge: 2015-03-06 | Disposition: A | Discharge status: Home / Self Care | Payer: Medicare Other | Source: Ambulatory Visit | Attending: Neurology | Admitting: Neurology

## 2015-03-06 ENCOUNTER — Telehealth: Payer: Self-pay | Admitting: Neurology

## 2015-03-06 DIAGNOSIS — R51 Headache: Principal | ICD-10-CM

## 2015-03-06 DIAGNOSIS — R519 Headache, unspecified: Secondary | ICD-10-CM

## 2015-03-06 DIAGNOSIS — R269 Unspecified abnormalities of gait and mobility: Secondary | ICD-10-CM

## 2015-03-06 DIAGNOSIS — G20 Parkinson's disease: Secondary | ICD-10-CM

## 2015-03-06 DIAGNOSIS — M79602 Pain in left arm: Secondary | ICD-10-CM

## 2015-03-06 DIAGNOSIS — G20C Parkinsonism, unspecified: Secondary | ICD-10-CM

## 2015-03-06 MED ORDER — ATORVASTATIN CALCIUM 40 MG PO TABS: 40.0000 mg | ORAL_TABLET | Freq: Every day | ORAL | Status: AC

## 2015-03-06 MED ORDER — GADOBENATE DIMEGLUMINE 529 MG/ML IV SOLN
10.0000 mL | Freq: Once | INTRAVENOUS | Status: AC | PRN
  Administered 2015-03-06: 10 mL via INTRAVENOUS

## 2015-03-06 NOTE — Telephone Encounter (Signed)
Called patient and spoke with her daughter, [place].  MRI brain results discussed which shows moderate intracranial stenosis.  Recommend medical management. She is already taking aspirin 325 mg daily. I offered to add Plavix, however weighing the risks versus the benefits, we elected to stay on monotherapy with aspirin alone. Increase Lipitor to 40 mg daily at least for 3 months.  If there are any new neurological event suspicious of TIA or stroke, low threshold to add dual antiplatelet therapy.  Headaches remain unchanged, however patient only started taking nortriptyline 10 mg last night. We will see how she does and adjust medications as needed.  Patient's daughter declined prednisone taper pack for acute management.  Davari Lopes K. Allena KatzPatel, DO

## 2015-03-17 ENCOUNTER — Telehealth: Payer: Self-pay | Admitting: *Deleted

## 2015-03-17 NOTE — Telephone Encounter (Signed)
Wayne with [facility] left a message requesting a verbal order for PT because they missed an appointment last week.

## 2015-03-17 NOTE — Telephone Encounter (Signed)
Ok for PT 

## 2015-03-17 NOTE — Telephone Encounter (Signed)
Left message on voicemail.

## 2015-03-21 ENCOUNTER — Telehealth: Payer: Self-pay | Admitting: Internal Medicine

## 2015-03-21 NOTE — Telephone Encounter (Signed)
Stacy @ [facility] wanted to see if dr Dayton Martesaron is ok with her doing 2 more week of PT in home

## 2015-03-21 NOTE — Telephone Encounter (Signed)
Ok for home health x 2 weeks

## 2015-03-21 NOTE — Telephone Encounter (Signed)
Route to PCP.  If needs MD approval, then yes, I am ok with this.

## 2015-03-21 NOTE — Telephone Encounter (Addendum)
[facility] x 2 weeks.

## 2015-03-25 ENCOUNTER — Other Ambulatory Visit: Payer: Medicare Other

## 2015-04-07 ENCOUNTER — Telehealth: Payer: Self-pay | Admitting: Internal Medicine

## 2015-04-07 NOTE — Telephone Encounter (Signed)
Spoke to [place] and provided verbal orders as requested.

## 2015-04-07 NOTE — Telephone Encounter (Signed)
Ok to give verbal orders as request.  Route to PCP as well.

## 2015-04-07 NOTE — Telephone Encounter (Signed)
Lisa Evans needs a verbal order for continuation of PT at [facility] , [phone] [facility]

## 2015-04-09 ENCOUNTER — Ambulatory Visit (INDEPENDENT_AMBULATORY_CARE_PROVIDER_SITE_OTHER): Payer: Medicare Other | Admitting: Neurology

## 2015-04-09 ENCOUNTER — Encounter: Payer: Self-pay | Admitting: Neurology

## 2015-04-09 VITALS — BP 120/70 | HR 65 | Ht 60.0 in | Wt 102.1 lb

## 2015-04-09 DIAGNOSIS — R519 Headache, unspecified: Secondary | ICD-10-CM

## 2015-04-09 DIAGNOSIS — R51 Headache: Secondary | ICD-10-CM

## 2015-04-09 DIAGNOSIS — G20 Parkinson's disease: Secondary | ICD-10-CM | POA: Diagnosis not present

## 2015-04-09 DIAGNOSIS — R269 Unspecified abnormalities of gait and mobility: Secondary | ICD-10-CM

## 2015-04-09 MED ORDER — CARBIDOPA-LEVODOPA 25-100 MG PO TABS: 1.0000 | ORAL_TABLET | Freq: Three times a day (TID) | ORAL | Status: AC

## 2015-04-09 NOTE — Patient Instructions (Addendum)
Happy [birth year] 89th Birthday!  Start Carbidopa Levodopa as follows:   Week 1:  1/2 tab three times a day before meals  Week 2:  1/2 in am & noon & 1 tab in evening  Week 3:  1/2 in am & 1 at noon & 1 tab in evening for a week,   Week 4:  1 tablet three times a day before meals.  Increase nortriptyline 20mg  bedtime  Return to clinic in 732-months

## 2015-04-09 NOTE — Progress Notes (Signed)
Follow-up Visit   Date: 04/09/2015    Lisa Evans MRN: [id number] DOB: January 30, 1923   Interim History: Lisa Evans is a 81 y.o. right-handed Caucasian female with CAD with MI (1999) s/p CABG, hypertension, hyperlipidemia, depression, and GERD returning to the clinic for follow-up of headaches.  The patient was accompanied to the clinic by daughter who also provides collateral information.    History of present illness: For many years, she has always had problems with headaches and balance, which she attributed to sinus problems. In late January 2016, she was turning to put tea in the refrigerator and lost her balance and fell to the ground. He hit the back of her headache and sustained a superficial laceration. There was no loss of consciousness. She was unable to get get up by herself and waited for her granddaughter to help her up. When her daughter arrived a few minutes later, she took patient to the emergency department. CT head did not show any acute abnormalities. Unfortunately, daughter tells me that the laceration was not cleaned or sutered and the following week, when she went to get her hair washed at the salon, the scab came off and bleeding restarted. She followed-up with her PCP later that afternoon who started antibiotics. Since the, her laceration has healed.  Headaches have become more severe since her fall. They are bifrontal and aches constantly. She is taking up to four tylenol per day, which provides minimal relief. She has associated nausea, photophobia, and phonophobia.   She also complains of vision problems, which started in February 2015. She woke up one morning with vision loss in her right eye and was told she had a corneal abrasion. She has seen four different eye doctors at [facility] without a clear explanation for her vision loss.  She has a long history of balance, but has only been using a cane and walker over the past. She falls about once per week,  without any significant injuries. Denies numbness or tingling of the legs. She endorses problems with swallowing solids and is on soft diet, changes in her sense of smell, writing changes, slowed movements, and and constipation. Denies any restless leg symptoms, vivid dreams.   UPDATE 04/09/2015:  Unfortunately, there is no improvement with her headaches on nortriptyline  qhs, but she also denies any side effects.  MRI/A brain shows moderate intracranial stenosis and she was started on aspirin as well as statin therapy.  She has fallen twice due to imbalance and bruised her chest.  Daughter has noticed that she has freezing spells when walking at times.    Medications:  Current Outpatient Prescriptions on File Prior to Visit  Medication Sig Dispense Refill  . Artificial Tear Ointment (REFRESH LACRI-LUBE) OINT Place 1 application into both eyes at bedtime.    Marland Kitchen aspirin 325 MG tablet Take 325 mg by mouth daily.    Marland Kitchen atorvastatin (LIPITOR) 40 MG tablet Take 1 tablet (40 mg total) by mouth daily. (Patient taking differently: Take 20 mg by mouth daily. ) 30 tablet 3  . fluticasone (FLONASE) 50 MCG/ACT nasal spray Place 2 sprays into both nostrils daily. X 2-3 times a week    . lisinopril (PRINIVIL,ZESTRIL) 20 MG tablet Take 1 tablet (20 mg total) by mouth daily. 30 tablet 5  . meclizine (ANTIVERT) 25 MG tablet Take 25 mg by mouth daily.     . metoprolol succinate (TOPROL-XL) 25 MG 24 hr tablet Take 1 tablet (25 mg total) by mouth daily. 30 tablet  5  . Multiple Vitamins-Minerals (MULTIVITAMIN & MINERAL PO) Take 1 capsule by mouth daily.    . nortriptyline (PAMELOR) 10 MG capsule Take 1 capsule (10 mg total) by mouth at bedtime. 30 capsule 5  . omeprazole (PRILOSEC) 20 MG capsule Take 20 mg by mouth daily.    . pantoprazole (PROTONIX) 40 MG tablet Take 1 tablet (40 mg total) by mouth daily. 30 tablet 3   No current facility-administered medications on file prior to visit.    Allergies:    Allergies  Allergen Reactions  . Penicillins Hives    Review of Systems:  CONSTITUTIONAL: No fevers, chills, night sweats, or weight loss.  EYES: No visual changes or eye pain ENT: No hearing changes.  No history of nose bleeds.   RESPIRATORY: No cough, wheezing and shortness of breath.   CARDIOVASCULAR: Negative for chest pain, and palpitations.   GI: Negative for abdominal discomfort, blood in stools or black stools.  No recent change in bowel habits.   GU:  No history of incontinence.   MUSCLOSKELETAL: +history of joint pain or swelling.  No myalgias.   SKIN: +for lesions, rash, and itching.   ENDOCRINE: Negative for cold or heat intolerance, polydipsia or goiter.   PSYCH:  No depression or anxiety symptoms.   NEURO: As Above.   Vital Signs:  BP 120/70 mmHg  Pulse 65  Ht 5' (1.524 m)  Wt 102 lb 1 oz (46.295 kg)  BMI 19.93 kg/m2  SpO2 98%  Neurological Exam: MENTAL STATUS including orientation to time, place, person, recent and remote memory, attention span and concentration, language, and fund of knowledge is fair. Thin, frail lady.  Blunted affect, poor blink.  CRANIAL NERVES: Pupils equal round and reactive to light.  Normal conjugate, extra-ocular eye movements in all directions of gaze, except upward gaze is restricted.   Face is symmetric.   MOTOR:  Motor strength is 4/5 in all extremities, with generalized loss of muscle bulk.  No pronator drift.  Tone is mildly increased in the LUE.    MSRs:  Reflexes are 2+/4 throughout, except absent Achilles bilaterally.  COORDINATION/GAIT:  Generalized bradykinesia with severely reduced rate and amplitude of finger tapping (L>R).   GAIT:  She is in wheelchair today, so gait not tested  Data: MRI/A brain 03/06/2015: 1. Suggestion of a punctate left periatrial white matter lacunar infarct. No mass effect or hemorrhage. This might be clinically silent. 2. High-grade stenosis (radiographic string sign) at the left vertebral  artery origin. Normal right vertebral artery. 3. Bilateral carotid bifurcation atherosclerosis. 55% stenosis at the right ICA bulb. Less than 50% proximal left ICA stenosis. 4. Up to moderate right ICA siphon supra clinoid segment stenosis. Otherwise negative for age anterior circulation; motion artifact degrades detail of the ACA and MCA branches. 5. Tandem moderate to severe stenoses of the right PCA, with attenuated distal PCA flow signal. 6. Moderate for age cerebral white matter predominant signal changes in the brain compatible with chronic small vessel disease.  CT head wo contrast 12/13/2014: IMPRESSION: 1. No acute intracranial abnormalities. No intracranial hemorrhage. 2. No skull fracture.  MRA neck 10/03/2010: 1. Bilateral atherosclerosis disease involving origins of the internal carotid arteries in the neck but without hemodynamically significant stenosis identfied.  2. There is a high grade short segment stenosis at the origin of the left vertebral artery. Both vertebral arteries are patent. 3. Atrophy and mild chronic microvascular ischemic change.  IMPRESSION/PLAN: 1.  Chronic daily headaches  - Increase nortriptyline to   qhs  - MRI brain moderate microvascular ischemic changes, no focal findings  2.  Intracranial atherosclerosis with high grade stenosis of the left vertebral, bilateral carotid stenosis (RICA 55%, LICA 50%), R PCA stenosis.  Continue medical management  - Secondary prevention with aspirin  and Lipitor  daily  - Stroke warning signs discussed, low threshold to add dual antiplatelet therapy  3.  Parkinsonism - features of bradykinesia, blunted affect, severe shuffling gait, freezing spells.   Start Carbidopa Levodopa as follows:   Week 1:  1/2 tab three times a day before meals  Week 2:  1/2 in am & noon & 1 tab in evening  Week 3:  1/2 in am & 1 at noon & 1 tab in evening for a week,   Week 4:  1 tablet three times a day before  meals.   Going forward, consider adding amantadine for freezing spells  4. Right vision changes (2015), ?old retinal artery occlusion  5.  CKD, Cr. 1.6 with GFR 32  - renally adjust medications  Return to clinic in 2 months  The duration of this appointment visit was 30 minutes of face-to-face time with the patient.  Greater than 50% of this time was spent in counseling, explanation of diagnosis, planning of further management, and coordination of care.   Thank you for allowing me to participate in patient's care.  If I can answer any additional questions, I would be pleased to do so.    Sincerely,    Donika K. Allena Katz, DO

## 2015-04-10 ENCOUNTER — Telehealth: Payer: Self-pay | Admitting: Neurology

## 2015-04-10 NOTE — Telephone Encounter (Signed)
I called Lisa Evans back and let her know that the only medication added yesterday was the Sinemet.  She said that that was the only one that she received yesterday.  She will inform patient's daughter.

## 2015-04-10 NOTE — Telephone Encounter (Signed)
[facility] and states that she is to have two rx please call and ask [facility]  [phone] and let her know what rx she is to have

## 2015-04-21 ENCOUNTER — Telehealth: Payer: Self-pay | Admitting: Neurology

## 2015-04-21 NOTE — Telephone Encounter (Signed)
I called patient's daughter back and she said that her mom did ok for a few days with the carbidopa-levodopa but the Wednesday after she started it, she started hearing music and having a dry mouth.  Thursday she said there were boys outside by her car, Friday she said a man was in her car and Saturday she said that someone had used her bathroom and messed it up.  Lawson FiscalLori, patient's daughter said that she stopped the carbidopa-levodopa but patient is still on the nortriptyline 20 mg.  Patient also had a fall about a little over a week ago.  Bruised leg but nothing broken.  Patient's feet turn a deep purple when they are not propped up.  Lawson FiscalLori said that she knows they are getting to the end.  I mentioned palliative care and she agreed to talk to them if ok to set this up.  Please advise.  Thanks.

## 2015-04-21 NOTE — Telephone Encounter (Signed)
Sorry to hear she is not doing well and had a few falls. I would recommend going down on the dose of sinemet to half tablet three times daily and see if we can get the benefit with less side effects.  She should minimize walking as able, to avoid falls, and use a chair as needed.  She may want to ask her PCP regarding discoloration of her legs.  Palliative care is a good option for her, please send referral if patient's daughter is agreeable.

## 2015-04-21 NOTE — Telephone Encounter (Signed)
Pt's daughter called and would like a call back in regards to her medication carbidopa-levpdopa/dawn [phone]

## 2015-04-22 ENCOUNTER — Telehealth: Payer: Self-pay | Admitting: Internal Medicine

## 2015-04-22 NOTE — Telephone Encounter (Signed)
I spoke with patient's daughter and she agreed with plan.  She said that her mom was having hallucinations while she was on just 1/2 pill tid.  Should she try 1/2 bid?  Please advise.

## 2015-04-22 NOTE — Telephone Encounter (Signed)
Daughter came by and wanted to give you an update on what was happening from neurology,  Please call daughter back [phone]

## 2015-04-22 NOTE — Telephone Encounter (Signed)
Pt's daughter states pt saw Dr Allena KatzPatel Neuro--Dx narrowing of vessels--was put on Nortriptyline and Dx with Parkinson's w/o the tremors--was also started Carbidopa/Levodopa 1/2 TID--the 1 whole pill---[name] states her mother has been hearing and seeing things that are not there-- "seeing boys play outside" "man in her car"---possible hallucinations-SOB--dry mouth--daughter had her stopped medication over the past weekend---[name] is concerned with pt having severe leg weakness...Marland Kitchen.Marland Kitchen.feet turning blue after prolonged sitting and not as much when feet are elevated.Marland Kitchen.Marland Kitchen.Dr at neuro called Lawson FiscalLori back and suggested Palliative care ---was advised to restart Rx at 1/2 pill BID by Dr Allena KatzPatel, but daughter has not started it back yet---has been choking while eating--pt has appt 04/24/2015 to speak with you

## 2015-04-22 NOTE — Telephone Encounter (Signed)
Lawson FiscalLori is going to try 1/2 pill bid and call us in a few days.

## 2015-04-22 NOTE — Telephone Encounter (Signed)
Noted  

## 2015-04-22 NOTE — Telephone Encounter (Signed)
Let's try 1 tab BID, if no difference, we can try an extended release formulation.

## 2015-04-23 ENCOUNTER — Telehealth: Payer: Self-pay | Admitting: *Deleted

## 2015-04-23 ENCOUNTER — Telehealth: Payer: Self-pay

## 2015-04-23 NOTE — Telephone Encounter (Signed)
Noted  

## 2015-04-23 NOTE — Telephone Encounter (Signed)
Hello Ms. Baity.  Just a heads up about this patient who is coming to see you tomorrow.  [facility].  They would like to discuss this with you to see what you think about it.  Just let us know and we will proceed with setting up care for her.  (we do not use the word hospice with her) :)  Thank you.

## 2015-04-23 NOTE — Telephone Encounter (Signed)
Lisa Evans with [facility] [place] [facility] left v/m requesting cb to verify received document faxed today for recertification form.Please advise.

## 2015-04-23 NOTE — Telephone Encounter (Signed)
[facility]--they gave me [place] location number [phone] ext [phone]----there was never an answer after being on hold x 3 minutes and no VM phone just hung up on me--will try again later

## 2015-04-24 ENCOUNTER — Encounter: Payer: Self-pay | Admitting: Internal Medicine

## 2015-04-24 ENCOUNTER — Ambulatory Visit (INDEPENDENT_AMBULATORY_CARE_PROVIDER_SITE_OTHER): Payer: Medicare Other | Admitting: Internal Medicine

## 2015-04-24 VITALS — BP 98/66 | HR 53 | Temp 97.4°F | Wt 100.0 lb

## 2015-04-24 DIAGNOSIS — F0390 Unspecified dementia without behavioral disturbance: Secondary | ICD-10-CM

## 2015-04-24 DIAGNOSIS — G8929 Other chronic pain: Secondary | ICD-10-CM

## 2015-04-24 DIAGNOSIS — N189 Chronic kidney disease, unspecified: Secondary | ICD-10-CM | POA: Diagnosis not present

## 2015-04-24 DIAGNOSIS — M79605 Pain in left leg: Secondary | ICD-10-CM

## 2015-04-24 DIAGNOSIS — R51 Headache: Secondary | ICD-10-CM

## 2015-04-24 DIAGNOSIS — R5383 Other fatigue: Secondary | ICD-10-CM | POA: Diagnosis not present

## 2015-04-24 DIAGNOSIS — G20 Parkinson's disease: Secondary | ICD-10-CM

## 2015-04-24 DIAGNOSIS — R531 Weakness: Secondary | ICD-10-CM | POA: Diagnosis not present

## 2015-04-24 DIAGNOSIS — I739 Peripheral vascular disease, unspecified: Secondary | ICD-10-CM

## 2015-04-24 DIAGNOSIS — I129 Hypertensive chronic kidney disease with stage 1 through stage 4 chronic kidney disease, or unspecified chronic kidney disease: Secondary | ICD-10-CM | POA: Diagnosis not present

## 2015-04-24 DIAGNOSIS — S7001XA Contusion of right hip, initial encounter: Secondary | ICD-10-CM

## 2015-04-24 DIAGNOSIS — I10 Essential (primary) hypertension: Secondary | ICD-10-CM

## 2015-04-24 MED ORDER — LISINOPRIL 20 MG PO TABS: 10.0000 mg | ORAL_TABLET | Freq: Every day | ORAL | Status: DC

## 2015-04-24 NOTE — Progress Notes (Signed)
Pre visit review using our clinic review tool, if applicable. No additional management support is needed unless otherwise documented below in the visit note. 

## 2015-04-24 NOTE — Progress Notes (Addendum)
Subjective:    Patient ID: Lisa Evans, female    DOB: October 21, 1923, 81 y.o.   MRN: [id number]  HPI  Pt presents to the clinic today to discuss multiple concerns.  1- She recently saw Neurology for evaluation of worsening headaches s/p a fall that occurred in 12/2014. She had a MRI/MRA of the head and MRA of the neck which showed:  IMPRESSION: 1. Suggestion of a punctate left periatrial white matter lacunar infarct. No mass effect or hemorrhage. This might be clinically silent. 2. High-grade stenosis (radiographic string sign) at the left vertebral artery origin. Normal right vertebral artery. 3. Bilateral carotid bifurcation atherosclerosis. 55% stenosis at the right ICA bulb. Less than 50% proximal left ICA stenosis. 4. Up to moderate right ICA siphon supra clinoid segment stenosis. Otherwise negative for age anterior circulation; motion artifact degrades detail of the ACA and MCA branches. 5. Tandem moderate to severe stenoses of the right PCA, with attenuated distal PCA flow signal. 6. Moderate for age cerebral white matter predominant signal changes in the brain compatible with chronic small vessel   She was continued on ASA 325 mg daily. Dr. Allena Katz considered adding Plavix but wanted to hold off at this time. She was started on Nortriptyline 290 mg daily and instructed to increase to 40 mg daily in 4 weeks. She reports her headaches are the same. They have not improved. Her daughter is concerned about her taking the medication because all she does is sleep all the time.  2- Also, while she was at her Neurology appt, she was diagnosed with Parkinsons. She has a shuffling gait, bent posture, flat affect and freezing of the legs when walking. She was started on Carvidopa-Levadopa, 1 pill TID. She started having hallucinations. Her daughter stopped her medication and called Dr. Allena Katz. She advised her to decrease it down to 1 tab BID. Her daughter did not feel comfortable with that and is  giving her a half a tab at bedtime. The patient reports having some bad dreams at night but no further hallucinations. Her daughter is not sure what to do with the medication.   3- Dr. Allena Katz also referred her for Palliative Care secondary to her deteriorating condition and multiple falls. She lives at home with her daughter. She sleeps well. Her main issues is her ability to get around and multiple falls. She forgets to take her walker with her and her legs give out. She is still continent of bowel and bladder. She feeds herself. She bathes and dresses with the assistance of her daughter.  4- Her daughter is also concerned about a bluish discoloration of her feet. This has been going on for the last few weeks. It seems to get worse throughout the day, while she is up and walking. It is not painful. She has not noticed any swelling. Elevation helps with the bluish discoloration. She has not tried anything OTC.  5- Her daughter also wants me to evaluate bruising to the right hip that occurred after a fall 2 weeks ago. The bruising has improved. It is still mildly tender. She has not has any issues walking. She did not have an xray done. She is working with PT currently on strengthening her lower extremities.   6- Daughter is also concerned that her mothers blood pressure is too low. Usually runs 100/60. She does not think it is contributing to her frequent falls but would like it to be evaluated. BP today is 98/66. She takes Lisinopril 20 mg daily.  Review of Systems      Past Medical History  Diagnosis Date  . Arthritis   . Chicken pox   . Depression   . Frequent headaches   . GERD (gastroesophageal reflux disease)   . History of stomach ulcers   . Chronic kidney disease   . Hyperlipidemia   . Hypertension   . Urine incontinence     Current Outpatient Prescriptions  Medication Sig Dispense Refill  . acetaminophen (TYLENOL) 500 MG tablet Take 500 mg by mouth every 6 (six) hours as needed.     . Artificial Tear Ointment (REFRESH LACRI-LUBE) OINT Place 1 application into both eyes at bedtime.    Marland Kitchen aspirin 325 MG tablet Take 325 mg by mouth daily.    Marland Kitchen atorvastatin (LIPITOR) 40 MG tablet Take 1 tablet (40 mg total) by mouth daily. (Patient taking differently: Take 20 mg by mouth daily. ) 30 tablet 3  . carbidopa-levodopa (SINEMET IR) 25-100 MG per tablet Take 1 tablet by mouth 3 (three) times daily. (Patient taking differently: Take 0.5 tablets by mouth daily. ) 90 tablet 5  . fluticasone (FLONASE) 50 MCG/ACT nasal spray Place 2 sprays into both nostrils daily. X 2-3 times a week    . lisinopril (PRINIVIL,ZESTRIL) 20 MG tablet Take 0.5 tablets (10 mg total) by mouth daily. 30 tablet 5  . meclizine (ANTIVERT) 25 MG tablet Take 25 mg by mouth daily.     . metoprolol succinate (TOPROL-XL) 25 MG 24 hr tablet Take 1 tablet (25 mg total) by mouth daily. 30 tablet 5  . Multiple Vitamins-Minerals (MULTIVITAMIN & MINERAL PO) Take 1 capsule by mouth daily.    . nortriptyline (PAMELOR) 10 MG capsule Take 1 capsule (10 mg total) by mouth at bedtime. 30 capsule 5  . omeprazole (PRILOSEC) 20 MG capsule Take 20 mg by mouth daily.    . pantoprazole (PROTONIX) 40 MG tablet Take 1 tablet (40 mg total) by mouth daily. (Patient taking differently: Take 40 mg by mouth daily as needed. ) 30 tablet 3   No current facility-administered medications for this visit.    Allergies  Allergen Reactions  . Penicillins Hives    Family History  Problem Relation Age of Onset  . Heart disease Father   . Hypertension Father   . Cancer Sister     Breast  . Hyperlipidemia Sister   . Heart disease Sister   . Kidney disease Sister   . Hyperlipidemia Brother   . Heart disease Brother   . Cancer Son     Lung  . Kidney disease Son     History   Social History  . Marital Status: Widowed    Spouse Name: N/A  . Number of Children: N/A  . Years of Education: N/A   Occupational History  . Not on file.    Social History Main Topics  . Smoking status: Never Smoker   . Smokeless tobacco: Never Used  . Alcohol Use: No  . Drug Use: No  . Sexual Activity: Not Currently   Other Topics Concern  . Not on file   Social History Narrative   Lives with daughter.   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   Highest level of education:  8th grade        Constitutional: Pt reports fatigue and headaches. Denies fever, malaise, or abrupt weight changes.  HEENT: Denies eye pain, eye redness, ear pain, ringing in the ears, wax buildup, runny nose, nasal congestion, bloody nose, or  sore throat. Respiratory: Denies difficulty breathing, shortness of breath, cough or sputum production.   Cardiovascular: Denies chest pain, chest tightness, palpitations or swelling in the hands or feet.  Gastrointestinal: Denies abdominal pain, bloating, constipation, diarrhea or blood in the stool.  GU: Denies urgency, frequency, pain with urination, burning sensation, blood in urine, odor or discharge. Musculoskeletal: Pt reports muscles weakness and multiple falls. Denies decrease in range of motion, muscle pain or joint pain and swelling.  Skin: Pt reports bruising to right hip and discolroation of feet. Denies redness, rashes, lesions or ulcercations.  Neurological: Pt reports difficulty with memory, balance and coordination. Denies dizziness, difficulty with speech.  Psych: Pt denies anxiety, depression, SI/HI.  No other specific complaints in a complete review of systems (except as listed in HPI above).  Objective:   Physical Exam    BP 98/66 mmHg  Pulse 53  Temp(Src) 97.4 F (36.3 C) (Oral)  Wt 100 lb (45.36 kg)  SpO2 98% Wt Readings from Last 3 Encounters:  04/24/15 100 lb (45.36 kg)  04/09/15 102 lb 1 oz (46.295 kg)  03/06/15 105 lb (47.628 kg)    General: Appears her stated age, chronically ill appearing in NAD. Skin: Warm, dry and intact. Bluish discoloration noted of bilateral toes. Large  hematoma of right hip, seems to be resolving. HEENT: Normal Neck: No adenopathy noted.  Cardiovascular: Normal rate and rhythm. S1,S2 noted.  No murmur, rubs or gallops noted. Pedal pulses 2+ bilaterally. No edema noted. Pulmonary/Chest: Normal effort and positive vesicular breath sounds. No respiratory distress. No wheezes, rales or ronchi noted.  Abdomen: Soft and nontender.  Musculoskeletal: Unable to evaluate as she is in the wheelchair. Neurological: Awake, some confusion noted.  Psychiatric: Mood and affect normal. Behavior is normal. Judgment and thought content normal.    BMET    Component Value Date/Time   NA 134* 11/18/2014 1706   K 4.5 11/18/2014 1706   CL 99 11/18/2014 1706   CO2 29 11/18/2014 1706   GLUCOSE 82 11/18/2014 1706   BUN 40* 11/18/2014 1706   CREATININE 1.6* 11/18/2014 1706   CALCIUM 9.4 11/18/2014 1706   GFRNONAA 41* 07/19/2014 1303   GFRAA 47* 07/19/2014 1303    Lipid Panel     Component Value Date/Time   CHOL 185 07/20/2014 0357   TRIG 54 07/20/2014 0357   HDL 69 07/20/2014 0357   CHOLHDL 2.7 07/20/2014 0357   VLDL 11 07/20/2014 0357   LDLCALC 105* 07/20/2014 0357    CBC    Component Value Date/Time   WBC 4.8 11/18/2014 1706   RBC 3.48* 11/18/2014 1706   HGB 9.9* 11/18/2014 1706   HCT 30.2* 11/18/2014 1706   PLT 248.0 11/18/2014 1706   MCV 86.6 11/18/2014 1706   MCH 28.6 07/19/2014 1303   MCHC 32.8 11/18/2014 1706   RDW 13.7 11/18/2014 1706   LYMPHSABS 0.7 07/19/2014 1303   MONOABS 0.5 07/19/2014 1303   EOSABS 0.0 07/19/2014 1303   BASOSABS 0.0 07/19/2014 1303    Hgb A1C No results found for: HGBA1C     Assessment & Plan:   Headaches:  Stop Nortriptyline as it is causing too much drowsiness, and does not seem to be effective anyway Discuss with neurology alternative options  Dementia:  Likely Vascular Continue ASA No Plavix secondary to bleeding risk from frequent falls  Parkinson's:  Slowly titrate up  Carvidopa-Levodopa as this may help with balance and gait issues  Fatigue, generalized weakness:  I do not think she is  ready for palliative care at this time- daughter agrees If conditions worsens, will reevaluate  Discoloration of feet:  Likely PAD Continue ASA No other intervention needed  Right hip hematoma:  Seems to be resolving Continue to monitor  HTN:  Will decrease Lisinopril to 10 mg daily  She will follow up as needed

## 2015-04-24 NOTE — Patient Instructions (Signed)

## 2015-04-24 NOTE — Addendum Note (Signed)
Addended by: Lorre Munroe on: 04/24/2015 03:00 PM   Modules accepted: Level of Service

## 2015-05-20 ENCOUNTER — Other Ambulatory Visit: Payer: Self-pay | Admitting: Internal Medicine

## 2015-05-20 ENCOUNTER — Telehealth: Payer: Self-pay | Admitting: Internal Medicine

## 2015-05-20 NOTE — Telephone Encounter (Signed)
[facility] called She was going to discharged Lisa Evans last week but Lisa Notch was sick and could not discharged She would like a verbal order to continue for 1 more week;

## 2015-05-20 NOTE — Telephone Encounter (Signed)
[facility] to [facility] to continue care x 1 week [facility].

## 2015-05-20 NOTE — Telephone Encounter (Signed)
Ok to continue x 1 week

## 2015-06-04 ENCOUNTER — Ambulatory Visit: Payer: Medicare Other | Admitting: Neurology

## 2015-06-09 ENCOUNTER — Ambulatory Visit (INDEPENDENT_AMBULATORY_CARE_PROVIDER_SITE_OTHER): Payer: Medicare Other | Admitting: Neurology

## 2015-06-09 ENCOUNTER — Encounter: Payer: Self-pay | Admitting: Neurology

## 2015-06-09 VITALS — BP 118/80 | HR 45 | Ht 60.0 in | Wt 99.4 lb

## 2015-06-09 DIAGNOSIS — R269 Unspecified abnormalities of gait and mobility: Secondary | ICD-10-CM | POA: Diagnosis not present

## 2015-06-09 DIAGNOSIS — G20 Parkinson's disease: Secondary | ICD-10-CM

## 2015-06-09 DIAGNOSIS — R51 Headache: Secondary | ICD-10-CM | POA: Diagnosis not present

## 2015-06-09 DIAGNOSIS — R519 Headache, unspecified: Secondary | ICD-10-CM

## 2015-06-09 MED ORDER — TOPIRAMATE 25 MG PO TABS: 25.0000 mg | ORAL_TABLET | Freq: Every day | ORAL | Status: DC

## 2015-06-09 NOTE — Progress Notes (Signed)
Follow-up Visit   Date: 06/09/2015    Lisa Evans MRN: [id number] DOB: [birth year]   Interim History: Lisa Evans is a 81 y.o. right-handed Caucasian female with CAD with MI (1999) s/p CABG, hypertension, hyperlipidemia, depression, and GERD returning to the clinic for follow-up of headaches and parkinsonism.  The patient was accompanied to the clinic by daughter who also provides collateral information.    History of present illness: For many years, she has always had problems with headaches and balance, which she attributed to sinus problems. In late January 2016, she was turning to put tea in the refrigerator and lost her balance and fell to the ground. He hit the back of her headache and sustained a superficial laceration. There was no loss of consciousness. When her daughter arrived a few minutes later, she took patient to the emergency department. CT head did not show any acute abnormalities. Headaches have become more severe since her fall. They are bifrontal and aches constantly. She is taking up to four tylenol per day, which provides minimal relief. She has associated nausea, photophobia, and phonophobia.   She also complains of vision problems, which started in February 2015. She woke up one morning with vision loss in her right eye and was told she had a corneal abrasion. She has seen four different eye doctors at [facility] without a clear explanation for her vision loss.  She has a long history of balance, but has only been using a cane and walker over the past. She falls about once per week, without any significant injuries. Denies numbness or tingling of the legs. She endorses problems with swallowing solids and is on soft diet, changes in her sense of smell, writing changes, slowed movements, and and constipation. Denies any restless leg symptoms, vivid dreams.   UPDATE 04/09/2015:  Unfortunately, there is no improvement with her headaches on nortriptyline 10mg  qhs,  but she also denies any side effects.  MRI/A brain shows moderate intracranial stenosis and she was started on aspirin as well as statin therapy.  She has fallen twice due to imbalance and bruised her chest.  Daughter has noticed that she has freezing spells when walking at times.   UPDATE 06/09/2015:  She stopped nortriptyline because of somnolence and was unable to increase sinemet due to hallucinations.  Headaches continue to be most problematic.  No interval falls.  Appetite remains poor and she is not drinking much fluids either.    Medications:  Current Outpatient Prescriptions on File Prior to Visit  Medication Sig Dispense Refill  . acetaminophen (TYLENOL) 500 MG tablet Take 500 mg by mouth every 6 (six) hours as needed.    . Artificial Tear Ointment (REFRESH LACRI-LUBE) OINT Place 1 application into both eyes at bedtime.    Marland Kitchen aspirin 325 MG tablet Take 325 mg by mouth daily.    Marland Kitchen atorvastatin (LIPITOR) 40 MG tablet Take 1 tablet (40 mg total) by mouth daily. (Patient taking differently: Take 20 mg by mouth daily. ) 30 tablet 3  . carbidopa-levodopa (SINEMET IR) 25-100 MG per tablet Take 1 tablet by mouth 3 (three) times daily. (Patient taking differently: Take 0.5 tablets by mouth daily. 1 1/2 tablets a day 1/2 in the morning 1 in the evening) 90 tablet 5  . fluticasone (FLONASE) 50 MCG/ACT nasal spray Place 2 sprays into both nostrils daily. X 2-3 times a week    . lisinopril (PRINIVIL,ZESTRIL) 20 MG tablet Take 0.5 tablets (10 mg total) by mouth daily. 30 tablet  5  . meclizine (ANTIVERT) 25 MG tablet Take 25 mg by mouth daily.     . metoprolol succinate (TOPROL-XL) 25 MG 24 hr tablet TAKE 1 TABLET BY MOUTH DAILY 30 tablet 5  . Multiple Vitamins-Minerals (MULTIVITAMIN & MINERAL PO) Take 1 capsule by mouth daily.    Marland Kitchen omeprazole (PRILOSEC) 20 MG capsule Take 20 mg by mouth daily.    . pantoprazole (PROTONIX) 40 MG tablet Take 1 tablet (40 mg total) by mouth daily. (Patient taking  differently: Take 40 mg by mouth daily as needed. ) 30 tablet 3   No current facility-administered medications on file prior to visit.    Allergies:  Allergies  Allergen Reactions  . Penicillins Hives    Review of Systems:  CONSTITUTIONAL: No fevers, chills, night sweats, +weight loss.  EYES: No visual changes or eye pain ENT: No hearing changes.  No history of nose bleeds.   RESPIRATORY: No cough, wheezing and shortness of breath.   CARDIOVASCULAR: Negative for chest pain, and palpitations.   GI: Negative for abdominal discomfort, blood in stools or black stools.  No recent change in bowel habits.   GU:  No history of incontinence.   MUSCLOSKELETAL: +history of joint pain or swelling.  No myalgias.   SKIN: +for lesions, rash, and itching.   ENDOCRINE: Negative for cold or heat intolerance, polydipsia or goiter.   PSYCH:  No depression or anxiety symptoms.   NEURO: As Above.   Vital Signs:  BP 118/80 mmHg  Pulse 45  Ht 5' (1.524 m)  Wt 99 lb 7 oz (45.105 kg)  BMI 19.42 kg/m2  SpO2 99%  Neurological Exam: MENTAL STATUS including orientation to time, place, person, recent and remote memory, attention span and concentration, language, and fund of knowledge is fair. Thin, frail lady.  Blunted affect, but appropriately smiles.    CRANIAL NERVES: Pupils equal round and reactive to light.  Normal conjugate, extra-ocular eye movements in all directions of gaze, except upward gaze is restricted.   Face is symmetric.   MOTOR:  Motor strength is 4/5 in all extremities, with generalized loss of muscle bulk.  No pronator drift.  Tone is mildly increased in the LUE.    MSRs:  Reflexes are 2+/4 throughout, except absent Achilles bilaterally.  COORDINATION/GAIT:  Generalized bradykinesia with severely reduced rate and amplitude of finger tapping (L>R).   GAIT:  Wheelchair bound  Data: MRI/A brain 03/06/2015: 1. Suggestion of a punctate left periatrial white matter lacunar infarct. No  mass effect or hemorrhage. This might be clinically silent. 2. High-grade stenosis (radiographic string sign) at the left vertebral artery origin. Normal right vertebral artery. 3. Bilateral carotid bifurcation atherosclerosis. 55% stenosis at the right ICA bulb. Less than 50% proximal left ICA stenosis. 4. Up to moderate right ICA siphon supra clinoid segment stenosis. Otherwise negative for age anterior circulation; motion artifact degrades detail of the ACA and MCA branches. 5. Tandem moderate to severe stenoses of the right PCA, with attenuated distal PCA flow signal. 6. Moderate for age cerebral white matter predominant signal changes in the brain compatible with chronic small vessel disease.  CT head wo contrast 12/13/2014: IMPRESSION: 1. No acute intracranial abnormalities. No intracranial hemorrhage. 2. No skull fracture.  MRA neck 10/03/2010: 1. Bilateral atherosclerosis disease involving origins of the internal carotid arteries in the neck but without hemodynamically significant stenosis identfied.  2. There is a high grade short segment stenosis at the origin of the left vertebral artery. Both vertebral arteries are  patent. 3. Atrophy and mild chronic microvascular ischemic change.  IMPRESSION/PLAN: 1.  Chronic daily headaches  - Stopped nortriptyline because of somnolence  - Start topamax 25mg  daily, encouraged to stay well hydrated  - MRI brain moderate microvascular ischemic changes, no focal findings  2.  Intracranial atherosclerosis with high grade stenosis of the left vertebral, bilateral carotid stenosis (RICA 55%, LICA 50%), R PCA stenosis.  Continue medical management  - Secondary prevention with aspirin 325mg  and Lipitor 20mg  daily  - Stroke warning signs discussed, low threshold to add dual antiplatelet therapy  3.  Parkinsonism - features of bradykinesia, blunted affect, severe shuffling gait, freezing spells.   - Increase Carbidopa Levodopa to 1 tablet  BID  4. Right vision changes (2015), ?old retinal artery occlusion  5.  CKD, Cr. 1.6 with GFR 32  - renally adjust medications  Return to clinic in 3 months  The duration of this appointment visit was 25 minutes of face-to-face time with the patient.  Greater than 50% of this time was spent in counseling, explanation of diagnosis, planning of further management, and coordination of care.   Thank you for allowing me to participate in patient's care.  If I can answer any additional questions, I would be pleased to do so.    Sincerely,    Donika K. Allena Katz, DO

## 2015-06-09 NOTE — Patient Instructions (Addendum)
1.  Start topamax  daily 2.  Increase carbidopa to 25/100 1 tablet twice daily 3.  Return to clinic in 3-4 months

## 2015-06-27 ENCOUNTER — Inpatient Hospital Stay (HOSPITAL_COMMUNITY)
Admission: EM | Admit: 2015-06-27 | Discharge: 2015-07-01 | DRG: 481 | Disposition: A | Discharge status: Skilled Nursing Facility | Payer: Medicare Other | Attending: Internal Medicine | Admitting: Internal Medicine

## 2015-06-27 ENCOUNTER — Emergency Department (HOSPITAL_COMMUNITY): Payer: Medicare Other

## 2015-06-27 ENCOUNTER — Inpatient Hospital Stay (HOSPITAL_COMMUNITY): Payer: Medicare Other | Admitting: Certified Registered Nurse Anesthetist

## 2015-06-27 ENCOUNTER — Encounter (HOSPITAL_COMMUNITY)
Admission: EM | Disposition: A | Discharge status: Skilled Nursing Facility | Payer: Self-pay | Source: Home / Self Care | Attending: Internal Medicine

## 2015-06-27 ENCOUNTER — Encounter (HOSPITAL_COMMUNITY): Payer: Self-pay | Admitting: Emergency Medicine

## 2015-06-27 ENCOUNTER — Inpatient Hospital Stay (HOSPITAL_COMMUNITY): Payer: Medicare Other

## 2015-06-27 DIAGNOSIS — S72142A Displaced intertrochanteric fracture of left femur, initial encounter for closed fracture: Secondary | ICD-10-CM | POA: Diagnosis present

## 2015-06-27 DIAGNOSIS — Y92009 Unspecified place in unspecified non-institutional (private) residence as the place of occurrence of the external cause: Secondary | ICD-10-CM | POA: Diagnosis not present

## 2015-06-27 DIAGNOSIS — N179 Acute kidney failure, unspecified: Secondary | ICD-10-CM | POA: Insufficient documentation

## 2015-06-27 DIAGNOSIS — Z79899 Other long term (current) drug therapy: Secondary | ICD-10-CM

## 2015-06-27 DIAGNOSIS — I129 Hypertensive chronic kidney disease with stage 1 through stage 4 chronic kidney disease, or unspecified chronic kidney disease: Secondary | ICD-10-CM | POA: Diagnosis present

## 2015-06-27 DIAGNOSIS — F0390 Unspecified dementia without behavioral disturbance: Secondary | ICD-10-CM | POA: Diagnosis present

## 2015-06-27 DIAGNOSIS — D638 Anemia in other chronic diseases classified elsewhere: Secondary | ICD-10-CM | POA: Diagnosis present

## 2015-06-27 DIAGNOSIS — Z951 Presence of aortocoronary bypass graft: Secondary | ICD-10-CM | POA: Diagnosis not present

## 2015-06-27 DIAGNOSIS — R1310 Dysphagia, unspecified: Secondary | ICD-10-CM | POA: Diagnosis present

## 2015-06-27 DIAGNOSIS — D62 Acute posthemorrhagic anemia: Secondary | ICD-10-CM | POA: Diagnosis not present

## 2015-06-27 DIAGNOSIS — B9620 Unspecified Escherichia coli [E. coli] as the cause of diseases classified elsewhere: Secondary | ICD-10-CM | POA: Diagnosis present

## 2015-06-27 DIAGNOSIS — I10 Essential (primary) hypertension: Secondary | ICD-10-CM | POA: Diagnosis present

## 2015-06-27 DIAGNOSIS — S72001D Fracture of unspecified part of neck of right femur, subsequent encounter for closed fracture with routine healing: Secondary | ICD-10-CM | POA: Diagnosis not present

## 2015-06-27 DIAGNOSIS — Z880 Allergy status to penicillin: Secondary | ICD-10-CM | POA: Diagnosis not present

## 2015-06-27 DIAGNOSIS — W010XXA Fall on same level from slipping, tripping and stumbling without subsequent striking against object, initial encounter: Secondary | ICD-10-CM | POA: Diagnosis present

## 2015-06-27 DIAGNOSIS — T148XXA Other injury of unspecified body region, initial encounter: Secondary | ICD-10-CM

## 2015-06-27 DIAGNOSIS — M25552 Pain in left hip: Secondary | ICD-10-CM | POA: Diagnosis present

## 2015-06-27 DIAGNOSIS — F0391 Unspecified dementia with behavioral disturbance: Secondary | ICD-10-CM | POA: Diagnosis not present

## 2015-06-27 DIAGNOSIS — R32 Unspecified urinary incontinence: Secondary | ICD-10-CM | POA: Diagnosis present

## 2015-06-27 DIAGNOSIS — Z7982 Long term (current) use of aspirin: Secondary | ICD-10-CM

## 2015-06-27 DIAGNOSIS — N183 Chronic kidney disease, stage 3 unspecified: Secondary | ICD-10-CM | POA: Insufficient documentation

## 2015-06-27 DIAGNOSIS — E785 Hyperlipidemia, unspecified: Secondary | ICD-10-CM | POA: Diagnosis present

## 2015-06-27 DIAGNOSIS — Z8249 Family history of ischemic heart disease and other diseases of the circulatory system: Secondary | ICD-10-CM | POA: Diagnosis not present

## 2015-06-27 DIAGNOSIS — N390 Urinary tract infection, site not specified: Secondary | ICD-10-CM | POA: Diagnosis present

## 2015-06-27 DIAGNOSIS — S72002A Fracture of unspecified part of neck of left femur, initial encounter for closed fracture: Secondary | ICD-10-CM | POA: Diagnosis not present

## 2015-06-27 DIAGNOSIS — S72002D Fracture of unspecified part of neck of left femur, subsequent encounter for closed fracture with routine healing: Secondary | ICD-10-CM | POA: Diagnosis not present

## 2015-06-27 DIAGNOSIS — Z681 Body mass index (BMI) 19 or less, adult: Secondary | ICD-10-CM | POA: Diagnosis not present

## 2015-06-27 DIAGNOSIS — F329 Major depressive disorder, single episode, unspecified: Secondary | ICD-10-CM | POA: Diagnosis present

## 2015-06-27 DIAGNOSIS — I2510 Atherosclerotic heart disease of native coronary artery without angina pectoris: Secondary | ICD-10-CM | POA: Diagnosis present

## 2015-06-27 DIAGNOSIS — G20 Parkinson's disease: Secondary | ICD-10-CM | POA: Diagnosis present

## 2015-06-27 DIAGNOSIS — I959 Hypotension, unspecified: Secondary | ICD-10-CM | POA: Diagnosis not present

## 2015-06-27 DIAGNOSIS — Z419 Encounter for procedure for purposes other than remedying health state, unspecified: Secondary | ICD-10-CM

## 2015-06-27 DIAGNOSIS — S72009A Fracture of unspecified part of neck of unspecified femur, initial encounter for closed fracture: Secondary | ICD-10-CM | POA: Diagnosis present

## 2015-06-27 DIAGNOSIS — K219 Gastro-esophageal reflux disease without esophagitis: Secondary | ICD-10-CM | POA: Diagnosis present

## 2015-06-27 DIAGNOSIS — G20A1 Parkinson's disease without dyskinesia, without mention of fluctuations: Secondary | ICD-10-CM | POA: Diagnosis present

## 2015-06-27 HISTORY — DX: Heart failure, unspecified: I50.9

## 2015-06-27 HISTORY — DX: Headache: R51

## 2015-06-27 HISTORY — DX: Pure hypercholesterolemia, unspecified: E78.00

## 2015-06-27 HISTORY — PX: INTERTROCHANTERIC HIP FRACTURE SURGERY: SHX681

## 2015-06-27 HISTORY — DX: Chronic kidney disease, stage 3 (moderate): N18.3

## 2015-06-27 HISTORY — DX: Nausea with vomiting, unspecified: R11.2

## 2015-06-27 HISTORY — DX: Family history of other specified conditions: Z84.89

## 2015-06-27 HISTORY — DX: Iron deficiency anemia, unspecified: D50.9

## 2015-06-27 HISTORY — DX: Parkinson's disease: G20

## 2015-06-27 HISTORY — DX: Nonrheumatic mitral (valve) prolapse: I34.1

## 2015-06-27 HISTORY — DX: Other specified postprocedural states: Z98.890

## 2015-06-27 HISTORY — DX: Headache, unspecified: R51.9

## 2015-06-27 HISTORY — DX: Acute myocardial infarction, unspecified: I21.9

## 2015-06-27 HISTORY — DX: Chronic kidney disease, stage 3 unspecified: N18.30

## 2015-06-27 HISTORY — PX: INTRAMEDULLARY (IM) NAIL INTERTROCHANTERIC: SHX5875

## 2015-06-27 LAB — BASIC METABOLIC PANEL
Anion gap: 10 (ref 5–15)
BUN: 16 mg/dL (ref 6–20)
CO2: 28 mmol/L (ref 22–32)
CREATININE: 1.06 mg/dL — AB (ref 0.44–1.00)
Calcium: 9.9 mg/dL (ref 8.9–10.3)
Chloride: 99 mmol/L — ABNORMAL LOW (ref 101–111)
GFR, EST AFRICAN AMERICAN: 52 mL/min — AB (ref >60)
GFR, EST NON AFRICAN AMERICAN: 45 mL/min — AB (ref >60)
Glucose, Bld: 135 mg/dL — ABNORMAL HIGH (ref 65–99)
Potassium: 3.9 mmol/L (ref 3.5–5.1)
Sodium: 137 mmol/L (ref 135–145)

## 2015-06-27 LAB — CBC WITH DIFFERENTIAL/PLATELET
Basophils Absolute: 0 10*3/uL (ref 0.0–0.1)
Basophils Relative: 0 % (ref 0–1)
EOS ABS: 0.1 10*3/uL (ref 0.0–0.7)
EOS PCT: 1 % (ref 0–5)
HCT: 35.4 % — ABNORMAL LOW (ref 36.0–46.0)
Hemoglobin: 11.6 g/dL — ABNORMAL LOW (ref 12.0–15.0)
Lymphocytes Relative: 19 % (ref 12–46)
Lymphs Abs: 1.2 10*3/uL (ref 0.7–4.0)
MCH: 30.5 pg (ref 26.0–34.0)
MCHC: 32.8 g/dL (ref 30.0–36.0)
MCV: 93.2 fL (ref 78.0–100.0)
Monocytes Absolute: 0.3 10*3/uL (ref 0.1–1.0)
Monocytes Relative: 4 % (ref 3–12)
NEUTROS ABS: 4.7 10*3/uL (ref 1.7–7.7)
Neutrophils Relative %: 76 % (ref 43–77)
Platelets: 194 10*3/uL (ref 150–400)
RBC: 3.8 MIL/uL — ABNORMAL LOW (ref 3.87–5.11)
RDW: 12.8 % (ref 11.5–15.5)
WBC: 6.3 10*3/uL (ref 4.0–10.5)

## 2015-06-27 LAB — PROTIME-INR
INR: 1.09 (ref 0.00–1.49)
PROTHROMBIN TIME: 14.3 s (ref 11.6–15.2)

## 2015-06-27 LAB — URINE MICROSCOPIC-ADD ON

## 2015-06-27 LAB — URINALYSIS, ROUTINE W REFLEX MICROSCOPIC
BILIRUBIN URINE: NEGATIVE
Glucose, UA: NEGATIVE mg/dL
HGB URINE DIPSTICK: NEGATIVE
Ketones, ur: NEGATIVE mg/dL
Nitrite: POSITIVE — AB
PH: 5 (ref 5.0–8.0)
Protein, ur: NEGATIVE mg/dL
Specific Gravity, Urine: 1.013 (ref 1.005–1.030)
Urobilinogen, UA: 0.2 mg/dL (ref 0.0–1.0)

## 2015-06-27 LAB — SURGICAL PCR SCREEN
MRSA, PCR: NEGATIVE
STAPHYLOCOCCUS AUREUS: POSITIVE — AB

## 2015-06-27 LAB — ABO/RH: ABO/RH(D): B POS

## 2015-06-27 SURGERY — FIXATION, FRACTURE, INTERTROCHANTERIC, WITH INTRAMEDULLARY ROD
Anesthesia: General | Site: Hip | Laterality: Left

## 2015-06-27 MED ORDER — ONDANSETRON HCL 4 MG/2ML IJ SOLN: 4.0000 mg | Freq: Four times a day (QID) | INTRAMUSCULAR | Status: DC | PRN

## 2015-06-27 MED ORDER — METHOCARBAMOL 500 MG PO TABS
500.0000 mg | ORAL_TABLET | Freq: Four times a day (QID) | ORAL | Status: DC | PRN
  Administered 2015-06-27 – 2015-06-28 (×2): 500 mg via ORAL
  Filled 2015-06-27 (×3): qty 1

## 2015-06-27 MED ORDER — SODIUM CHLORIDE 0.9 % IV SOLN
INTRAVENOUS | Status: DC
  Administered 2015-06-27 – 2015-06-30 (×2): via INTRAVENOUS

## 2015-06-27 MED ORDER — GLYCOPYRROLATE 0.2 MG/ML IJ SOLN
INTRAMUSCULAR | Status: AC
  Filled 2015-06-27: qty 3

## 2015-06-27 MED ORDER — METOCLOPRAMIDE HCL 5 MG PO TABS: 5.0000 mg | ORAL_TABLET | Freq: Three times a day (TID) | ORAL | Status: DC | PRN

## 2015-06-27 MED ORDER — ENSURE ENLIVE PO LIQD: 237.0000 mL | Freq: Two times a day (BID) | ORAL | Status: DC

## 2015-06-27 MED ORDER — SUCCINYLCHOLINE CHLORIDE 20 MG/ML IJ SOLN
INTRAMUSCULAR | Status: AC
  Filled 2015-06-27: qty 1

## 2015-06-27 MED ORDER — PROPOFOL 10 MG/ML IV BOLUS
INTRAVENOUS | Status: DC | PRN
  Administered 2015-06-27: 100 mg via INTRAVENOUS

## 2015-06-27 MED ORDER — MORPHINE SULFATE 2 MG/ML IJ SOLN: 0.5000 mg | INTRAMUSCULAR | Status: DC | PRN

## 2015-06-27 MED ORDER — PHENYLEPHRINE HCL 10 MG/ML IJ SOLN
INTRAMUSCULAR | Status: DC | PRN
  Administered 2015-06-27: 120 ug via INTRAVENOUS
  Administered 2015-06-27 (×4): 80 ug via INTRAVENOUS

## 2015-06-27 MED ORDER — ROCURONIUM BROMIDE 100 MG/10ML IV SOLN
INTRAVENOUS | Status: DC | PRN
  Administered 2015-06-27: 30 mg via INTRAVENOUS

## 2015-06-27 MED ORDER — MECLIZINE HCL 25 MG PO TABS
25.0000 mg | ORAL_TABLET | Freq: Every day | ORAL | Status: DC
  Administered 2015-06-29 – 2015-07-01 (×3): 25 mg via ORAL
  Filled 2015-06-27 (×6): qty 1

## 2015-06-27 MED ORDER — METOPROLOL SUCCINATE ER 25 MG PO TB24
25.0000 mg | ORAL_TABLET | Freq: Every day | ORAL | Status: DC
  Administered 2015-06-27 – 2015-06-30 (×3): 25 mg via ORAL
  Filled 2015-06-27 (×3): qty 1

## 2015-06-27 MED ORDER — 0.9 % SODIUM CHLORIDE (POUR BTL) OPTIME
TOPICAL | Status: DC | PRN
  Administered 2015-06-27: 1000 mL

## 2015-06-27 MED ORDER — GLYCOPYRROLATE 0.2 MG/ML IJ SOLN
INTRAMUSCULAR | Status: DC | PRN
  Administered 2015-06-27: 0.6 mg via INTRAVENOUS

## 2015-06-27 MED ORDER — LACTATED RINGERS IV SOLN: INTRAVENOUS | Status: DC | PRN

## 2015-06-27 MED ORDER — ONDANSETRON HCL 4 MG PO TABS: 4.0000 mg | ORAL_TABLET | Freq: Four times a day (QID) | ORAL | Status: DC | PRN

## 2015-06-27 MED ORDER — ATORVASTATIN CALCIUM 10 MG PO TABS
20.0000 mg | ORAL_TABLET | Freq: Every day | ORAL | Status: DC
  Administered 2015-06-28 – 2015-07-01 (×4): 20 mg via ORAL
  Filled 2015-06-27 (×4): qty 2

## 2015-06-27 MED ORDER — HYDROCODONE-ACETAMINOPHEN 5-325 MG PO TABS
1.0000 | ORAL_TABLET | Freq: Four times a day (QID) | ORAL | Status: DC | PRN
  Administered 2015-06-27 – 2015-07-01 (×7): 1 via ORAL
  Filled 2015-06-27 (×7): qty 1

## 2015-06-27 MED ORDER — LACTATED RINGERS IV SOLN
INTRAVENOUS | Status: DC | PRN
  Administered 2015-06-27: 16:00:00 via INTRAVENOUS

## 2015-06-27 MED ORDER — ASPIRIN EC 325 MG PO TBEC
325.0000 mg | DELAYED_RELEASE_TABLET | Freq: Every day | ORAL | Status: DC
  Administered 2015-06-28 – 2015-07-01 (×4): 325 mg via ORAL
  Filled 2015-06-27 (×4): qty 1

## 2015-06-27 MED ORDER — ACETAMINOPHEN 650 MG RE SUPP: 650.0000 mg | Freq: Four times a day (QID) | RECTAL | Status: DC | PRN

## 2015-06-27 MED ORDER — METOCLOPRAMIDE HCL 5 MG/ML IJ SOLN: 5.0000 mg | Freq: Three times a day (TID) | INTRAMUSCULAR | Status: DC | PRN

## 2015-06-27 MED ORDER — FENTANYL CITRATE (PF) 100 MCG/2ML IJ SOLN: INTRAMUSCULAR | Status: DC | PRN

## 2015-06-27 MED ORDER — EPHEDRINE SULFATE 50 MG/ML IJ SOLN
INTRAMUSCULAR | Status: DC | PRN
  Administered 2015-06-27 (×4): 10 mg via INTRAVENOUS

## 2015-06-27 MED ORDER — PHENOL 1.4 % MT LIQD: 1.0000 | OROMUCOSAL | Status: DC | PRN

## 2015-06-27 MED ORDER — ACETAMINOPHEN 325 MG PO TABS
650.0000 mg | ORAL_TABLET | Freq: Four times a day (QID) | ORAL | Status: DC | PRN
  Administered 2015-06-29: 650 mg via ORAL
  Filled 2015-06-27: qty 2

## 2015-06-27 MED ORDER — ALBUTEROL SULFATE (2.5 MG/3ML) 0.083% IN NEBU: 2.5000 mg | INHALATION_SOLUTION | RESPIRATORY_TRACT | Status: DC | PRN

## 2015-06-27 MED ORDER — MORPHINE SULFATE 4 MG/ML IJ SOLN: 4.0000 mg | Freq: Once | INTRAMUSCULAR | Status: DC

## 2015-06-27 MED ORDER — NEOSTIGMINE METHYLSULFATE 10 MG/10ML IV SOLN
INTRAVENOUS | Status: DC | PRN
  Administered 2015-06-27: 3 mg via INTRAVENOUS

## 2015-06-27 MED ORDER — VANCOMYCIN HCL IN DEXTROSE 1-5 GM/200ML-% IV SOLN
1000.0000 mg | Freq: Two times a day (BID) | INTRAVENOUS | Status: AC
  Administered 2015-06-28: 1000 mg via INTRAVENOUS
  Filled 2015-06-27: qty 200

## 2015-06-27 MED ORDER — MORPHINE SULFATE 2 MG/ML IJ SOLN
2.0000 mg | Freq: Once | INTRAMUSCULAR | Status: AC
  Administered 2015-06-27: 2 mg via INTRAVENOUS
  Filled 2015-06-27: qty 1

## 2015-06-27 MED ORDER — VANCOMYCIN HCL IN DEXTROSE 1-5 GM/200ML-% IV SOLN
INTRAVENOUS | Status: AC
  Administered 2015-06-27: 1000 mg via INTRAVENOUS
  Filled 2015-06-27: qty 200

## 2015-06-27 MED ORDER — MUPIROCIN 2 % EX OINT
1.0000 "application " | TOPICAL_OINTMENT | Freq: Two times a day (BID) | CUTANEOUS | Status: DC
  Administered 2015-06-27 – 2015-07-01 (×8): 1 via NASAL
  Filled 2015-06-27 (×2): qty 22

## 2015-06-27 MED ORDER — ASPIRIN 325 MG PO TABS: 325.0000 mg | ORAL_TABLET | Freq: Every day | ORAL | Status: DC

## 2015-06-27 MED ORDER — CHLORHEXIDINE GLUCONATE CLOTH 2 % EX PADS
6.0000 | MEDICATED_PAD | Freq: Every day | CUTANEOUS | Status: AC
  Administered 2015-06-27 – 2015-07-01 (×5): 6 via TOPICAL

## 2015-06-27 MED ORDER — ACETAMINOPHEN 325 MG PO TABS: 650.0000 mg | ORAL_TABLET | Freq: Four times a day (QID) | ORAL | Status: DC | PRN

## 2015-06-27 MED ORDER — MENTHOL 3 MG MT LOZG: 1.0000 | LOZENGE | OROMUCOSAL | Status: DC | PRN

## 2015-06-27 MED ORDER — LISINOPRIL 10 MG PO TABS
10.0000 mg | ORAL_TABLET | Freq: Every day | ORAL | Status: DC
  Administered 2015-06-28: 10 mg via ORAL
  Filled 2015-06-27: qty 1

## 2015-06-27 MED ORDER — PROPOFOL 10 MG/ML IV BOLUS
INTRAVENOUS | Status: AC
  Filled 2015-06-27: qty 20

## 2015-06-27 MED ORDER — LIDOCAINE HCL (CARDIAC) 20 MG/ML IV SOLN
INTRAVENOUS | Status: AC
  Filled 2015-06-27: qty 10

## 2015-06-27 MED ORDER — NEOSTIGMINE METHYLSULFATE 10 MG/10ML IV SOLN
INTRAVENOUS | Status: AC
  Filled 2015-06-27: qty 1

## 2015-06-27 MED ORDER — METHOCARBAMOL 1000 MG/10ML IJ SOLN
500.0000 mg | Freq: Four times a day (QID) | INTRAVENOUS | Status: DC | PRN
  Filled 2015-06-27: qty 5

## 2015-06-27 MED ORDER — LIDOCAINE HCL (CARDIAC) 20 MG/ML IV SOLN
INTRAVENOUS | Status: DC | PRN
  Administered 2015-06-27: 30 mg via INTRAVENOUS

## 2015-06-27 MED ORDER — ONDANSETRON HCL 4 MG/2ML IJ SOLN
INTRAMUSCULAR | Status: DC | PRN
  Administered 2015-06-27: 4 mg via INTRAVENOUS

## 2015-06-27 MED ORDER — FENTANYL CITRATE (PF) 250 MCG/5ML IJ SOLN
INTRAMUSCULAR | Status: AC
  Filled 2015-06-27: qty 5

## 2015-06-27 MED ORDER — ONDANSETRON HCL 4 MG/2ML IJ SOLN
INTRAMUSCULAR | Status: AC
  Filled 2015-06-27: qty 4

## 2015-06-27 MED ORDER — SODIUM CHLORIDE 0.9 % IV SOLN
INTRAVENOUS | Status: DC
  Administered 2015-06-28: 10:00:00 via INTRAVENOUS

## 2015-06-27 MED ORDER — CARBIDOPA-LEVODOPA 25-100 MG PO TABS
0.5000 | ORAL_TABLET | Freq: Every day | ORAL | Status: DC
  Administered 2015-06-28: 0.5 via ORAL
  Administered 2015-06-29 – 2015-07-01 (×3): 1 via ORAL
  Filled 2015-06-27 (×4): qty 1

## 2015-06-27 MED ORDER — SODIUM CHLORIDE 0.9 % IV SOLN
INTRAVENOUS | Status: AC
  Administered 2015-06-27 (×2): via INTRAVENOUS

## 2015-06-27 MED ORDER — MORPHINE SULFATE 2 MG/ML IJ SOLN
2.0000 mg | INTRAMUSCULAR | Status: DC | PRN
  Administered 2015-06-27: 2 mg via INTRAVENOUS
  Filled 2015-06-27: qty 1

## 2015-06-27 SURGICAL SUPPLY — 48 items
BLADE SURG 15 STRL LF DISP TIS (BLADE) ×1 IMPLANT
BLADE SURG 15 STRL SS (BLADE) ×2
BNDG COHESIVE 4X5 TAN NS LF (GAUZE/BANDAGES/DRESSINGS) ×3 IMPLANT
BNDG GAUZE ELAST 4 BULKY (GAUZE/BANDAGES/DRESSINGS) ×3 IMPLANT
COVER PERINEAL POST (MISCELLANEOUS) ×3 IMPLANT
COVER SURGICAL LIGHT HANDLE (MISCELLANEOUS) ×3 IMPLANT
DRAPE PROXIMA HALF (DRAPES) IMPLANT
DRAPE STERI IOBAN 125X83 (DRAPES) ×3 IMPLANT
DRESSING ALLEVYN LIFE SACRUM (GAUZE/BANDAGES/DRESSINGS) ×3 IMPLANT
DRSG MEPILEX BORDER 4X4 (GAUZE/BANDAGES/DRESSINGS) ×6 IMPLANT
DRSG MEPILEX BORDER 4X8 (GAUZE/BANDAGES/DRESSINGS) IMPLANT
DRSG PAD ABDOMINAL 8X10 ST (GAUZE/BANDAGES/DRESSINGS) ×6 IMPLANT
DURAPREP 26ML APPLICATOR (WOUND CARE) ×3 IMPLANT
ELECT REM PT RETURN 9FT ADLT (ELECTROSURGICAL) ×3
ELECTRODE REM PT RTRN 9FT ADLT (ELECTROSURGICAL) ×1 IMPLANT
FACESHIELD WRAPAROUND (MASK) ×3 IMPLANT
GAUZE XEROFORM 1X8 LF (GAUZE/BANDAGES/DRESSINGS) ×3 IMPLANT
GAUZE XEROFORM 5X9 LF (GAUZE/BANDAGES/DRESSINGS) ×3 IMPLANT
GLOVE BIO SURGEON STRL SZ8 (GLOVE) ×3 IMPLANT
GLOVE BIOGEL PI IND STRL 8 (GLOVE) ×1 IMPLANT
GLOVE BIOGEL PI INDICATOR 8 (GLOVE) ×2
GLOVE ORTHO TXT STRL SZ7.5 (GLOVE) ×3 IMPLANT
GOWN STRL REUS W/ TWL LRG LVL3 (GOWN DISPOSABLE) ×1 IMPLANT
GOWN STRL REUS W/ TWL XL LVL3 (GOWN DISPOSABLE) ×2 IMPLANT
GOWN STRL REUS W/TWL LRG LVL3 (GOWN DISPOSABLE) ×2
GOWN STRL REUS W/TWL XL LVL3 (GOWN DISPOSABLE) ×4
GUIDE PIN 3.2X343 (PIN) ×2
GUIDE PIN 3.2X343MM (PIN) ×4
KIT BASIN OR (CUSTOM PROCEDURE TRAY) ×3 IMPLANT
KIT ROOM TURNOVER OR (KITS) ×3 IMPLANT
LINER BOOT UNIVERSAL DISP (MISCELLANEOUS) IMPLANT
MANIFOLD NEPTUNE II (INSTRUMENTS) IMPLANT
NAIL LEFT 10X36 (Nail) ×3 IMPLANT
NS IRRIG 1000ML POUR BTL (IV SOLUTION) ×3 IMPLANT
PACK GENERAL/GYN (CUSTOM PROCEDURE TRAY) ×3 IMPLANT
PAD ARMBOARD 7.5X6 YLW CONV (MISCELLANEOUS) ×6 IMPLANT
PAD CAST 4YDX4 CTTN HI CHSV (CAST SUPPLIES) ×2 IMPLANT
PADDING CAST COTTON 4X4 STRL (CAST SUPPLIES) ×4
PIN GUIDE 3.2X343MM (PIN) ×2 IMPLANT
SCREW LAG COMPR KIT 85/80 (Screw) ×3 IMPLANT
STAPLER VISISTAT 35W (STAPLE) ×3 IMPLANT
SUT VIC AB 0 CT1 27 (SUTURE) ×4
SUT VIC AB 0 CT1 27XBRD ANBCTR (SUTURE) ×2 IMPLANT
SUT VIC AB 2-0 CT1 27 (SUTURE) ×4
SUT VIC AB 2-0 CT1 TAPERPNT 27 (SUTURE) ×2 IMPLANT
TOWEL OR 17X24 6PK STRL BLUE (TOWEL DISPOSABLE) ×3 IMPLANT
TOWEL OR 17X26 10 PK STRL BLUE (TOWEL DISPOSABLE) ×3 IMPLANT
WATER STERILE IRR 1000ML POUR (IV SOLUTION) IMPLANT

## 2015-06-27 NOTE — Anesthesia Postprocedure Evaluation (Signed)
  Anesthesia Post-op Note  Patient: Lisa Evans  Procedure(s) Performed: Procedure(s): INTRAMEDULLARY (IM) NAIL INTERTROCHANTRIC (Left)  Patient Location: PACU  Anesthesia Type:General  Level of Consciousness: awake and alert   Airway and Oxygen Therapy: Patient Spontanous Breathing  Post-op Pain: mild  Post-op Assessment: Post-op Vital signs reviewed LLE Motor Response: Non-purposeful movement, Responds to commands LLE Sensation: Pain, Full sensation          Post-op Vital Signs: Reviewed and stable  Last Vitals:  Filed Vitals:   06/27/15 1745  BP: 115/58  Pulse:   Temp:   Resp: 15    Complications: No apparent anesthesia complications

## 2015-06-27 NOTE — ED Notes (Addendum)
EMS - Patient coming from home with c/o of left hip pain with shortening and outward rotation.  Patient given 50 Fentanyl, 0/10 pain at this time.  204/110, 68 HR, 99% on 4L after pain medication.  Patient was at her daughters house when she tripped up and fell on to a hardwood surface.  Patient has a skin tear to the left elbow and c/o of hitting the left side of the head against the wall.  Patient placed in pelvic binder with EMS using a sheet for support.

## 2015-06-27 NOTE — ED Notes (Signed)
Attempt to call report to floor x1. 

## 2015-06-27 NOTE — Anesthesia Preprocedure Evaluation (Addendum)
Anesthesia Evaluation  Patient identified by MRN, date of birth, ID band Patient awake    Reviewed: Allergy & Precautions, NPO status , Patient's Chart, lab work & pertinent test results  History of Anesthesia Complications (+) PONV  Airway Mallampati: II   Neck ROM: Limited    Dental  (+) Poor Dentition, Dental Advidsory Given, Partial Upper   Pulmonary neg pulmonary ROS,  breath sounds clear to auscultation- rhonchi        Cardiovascular Exercise Tolerance: Poor hypertension, Pt. on medications + CAD Rhythm:Regular Rate:Normal     Neuro/Psych  Headaches, PSYCHIATRIC DISORDERS Depression negative neurological ROS     GI/Hepatic Neg liver ROS, GERD-  ,  Endo/Other  negative endocrine ROS  Renal/GU Renal diseasenegative Renal ROS  negative genitourinary   Musculoskeletal negative musculoskeletal ROS (+) Arthritis -,   Abdominal   Peds negative pediatric ROS (+)  Hematology negative hematology ROS (+)   Anesthesia Other Findings   Reproductive/Obstetrics negative OB ROS                           Anesthesia Physical Anesthesia Plan  ASA: II  Anesthesia Plan: General   Post-op Pain Management:    Induction: Intravenous  Airway Management Planned: Oral ETT  Additional Equipment:   Intra-op Plan:   Post-operative Plan: Extubation in OR  Informed Consent: I have reviewed the patients History and Physical, chart, labs and discussed the procedure including the risks, benefits and alternatives for the proposed anesthesia with the patient or authorized representative who has indicated his/her understanding and acceptance.   Dental Advisory Given and Dental advisory given  Plan Discussed with: Anesthesiologist, CRNA and Surgeon  Anesthesia Plan Comments:        Anesthesia Quick Evaluation

## 2015-06-27 NOTE — ED Notes (Signed)
Admitting at bedside 

## 2015-06-27 NOTE — Brief Op Note (Signed)
06/27/2015  5:07 PM  PATIENT:  Lisa Evans  81 y.o. female  PRE-OPERATIVE DIAGNOSIS:  LEFT INTERTROCHENTERIC FRACTURE  POST-OPERATIVE DIAGNOSIS:  LEFT INTERTROCHENTERIC FRACTURE  PROCEDURE:  Procedure(s): INTRAMEDULLARY (IM) NAIL INTERTROCHANTRIC (Left)  SURGEON:  Surgeon(s) and Role:    * Kathryne Hitch, MD - Primary  PHYSICIAN ASSISTANT: Rexene Edison, PA-C  ANESTHESIA:   general  EBL:  Total I/O In: 900 [I.V.:900] Out: 350 [Urine:200; Blood:150]  BLOOD ADMINISTERED:none  DRAINS: none   LOCAL MEDICATIONS USED:  NONE  SPECIMEN:  No Specimen  DISPOSITION OF SPECIMEN:  N/A  COUNTS:  YES  TOURNIQUET:  * No tourniquets in log *  DICTATION: .Other Dictation: Dictation Number [id number]  PLAN OF CARE: Admit to inpatient   PATIENT DISPOSITION:  PACU - hemodynamically stable.   Delay start of Pharmacological VTE agent (>24hrs) due to surgical blood loss or risk of bleeding: no

## 2015-06-27 NOTE — ED Provider Notes (Signed)
CSN: [id number]     Arrival date & time 06/27/15  1016 History   First MD Initiated Contact with Patient 06/27/15 1017     Chief Complaint  Patient presents with  . Fall  . Hip Pain    left     (Consider location/radiation/quality/duration/timing/severity/associated sxs/prior Treatment) The history is provided by the patient and a relative.   Patient with hx parkinson's disease p/w left hip pain after fall.  Pt was at home, had parked her walker in the hallway and was walking to and from her room.  Her daughter entered the house and the patient looked up, lost her footing and fell.  Daughter states she fell on the left side and hit her head on the wall.  Denies LOC.  Denies any symptoms preceding the fall including HA, lightheadedness/dizziness, CP, SOB.  Pt denies any pain anywhere else.  EMS noted skin tear on left elbow, which has been bandaged.  Pt denies any other pain or symptoms at this time.    Past Medical History  Diagnosis Date  . Arthritis   . Chicken pox   . Depression   . Frequent headaches   . GERD (gastroesophageal reflux disease)   . History of stomach ulcers   . Chronic kidney disease   . Hyperlipidemia   . Hypertension   . Urine incontinence   . Parkinson disease    Past Surgical History  Procedure Laterality Date  . Coronary artery bypass graft      x 4   Family History  Problem Relation Age of Onset  . Heart disease Father   . Hypertension Father   . Cancer Sister     Breast  . Hyperlipidemia Sister   . Heart disease Sister   . Kidney disease Sister   . Hyperlipidemia Brother   . Heart disease Brother   . Cancer Son     Lung  . Kidney disease Son    Social History  Substance Use Topics  . Smoking status: Never Smoker   . Smokeless tobacco: Never Used  . Alcohol Use: No   OB History    No data available     Review of Systems  All other systems reviewed and are negative.     Allergies  Penicillins  Home Medications   Prior to  Admission medications   Medication Sig Start Date End Date Taking? Authorizing Provider  acetaminophen (TYLENOL) 500 MG tablet Take 500 mg by mouth every 6 (six) hours as needed.    Historical Provider, MD  Artificial Tear Ointment (REFRESH LACRI-LUBE) OINT Place 1 application into both eyes at bedtime.    Historical Provider, MD  aspirin 325 MG tablet Take 325 mg by mouth daily.    Historical Provider, MD  atorvastatin (LIPITOR) 40 MG tablet Take 1 tablet (40 mg total) by mouth daily. Patient taking differently: Take 20 mg by mouth daily.  03/06/15   Donika K Patel, DO  carbidopa-levodopa (SINEMET IR) 25-100 MG per tablet Take 1 tablet by mouth 3 (three) times daily. Patient taking differently: Take 0.5 tablets by mouth daily. 1 1/2 tablets a day 1/2 in the morning 1 in the evening 04/09/15   Donika K Patel, DO  fluticasone (FLONASE) 50 MCG/ACT nasal spray Place 2 sprays into both nostrils daily. X 2-3 times a week    Historical Provider, MD  lisinopril (PRINIVIL,ZESTRIL) 20 MG tablet Take 0.5 tablets (10 mg total) by mouth daily. 04/24/15   Lorre Munroe, NP  meclizine (  ANTIVERT) 25 MG tablet Take 25 mg by mouth daily.     Historical Provider, MD  metoprolol succinate (TOPROL-XL) 25 MG 24 hr tablet TAKE 1 TABLET BY MOUTH DAILY 05/20/15   Lorre Munroe, NP  Multiple Vitamins-Minerals (MULTIVITAMIN & MINERAL PO) Take 1 capsule by mouth daily.    Historical Provider, MD  omeprazole (PRILOSEC) 20 MG capsule Take 20 mg by mouth daily.    Historical Provider, MD  pantoprazole (PROTONIX) 40 MG tablet Take 1 tablet (40 mg total) by mouth daily. Patient taking differently: Take 40 mg by mouth daily as needed.  07/20/14   Marinda Elk, MD  topiramate (TOPAMAX) 25 MG tablet Take 1 tablet (25 mg total) by mouth daily. 06/09/15   Donika K Patel, DO   SpO2 99% Physical Exam  Constitutional: She appears well-developed and well-nourished. No distress.  HENT:  Head: Normocephalic and atraumatic.  Neck:  Neck supple.  Cardiovascular: Normal rate and regular rhythm.   Pulmonary/Chest: Effort normal and breath sounds normal. No respiratory distress. She has no wheezes. She has no rales.  Abdominal: Soft. She exhibits no distension. There is no tenderness. There is no rebound and no guarding.  Musculoskeletal:  Left hip shortened, outwardly rotated, mildly tender to palpation.  Left foot sensation intact, moves toes, distal pulses intact.    Left elbow with bandage in place.  No focal tenderness.  Pt moves upper extremities easily, distal sensation intact. No other focal bony tenderness throughout exam except left hip.    Spine nontender, no crepitus, or stepoffs.   Neurological: She is alert.  Skin: She is not diaphoretic.  Nursing note and vitals reviewed.   ED Course  Procedures (including critical care time) Labs Review Labs Reviewed  BASIC METABOLIC PANEL - Abnormal; Notable for the following:    Chloride 99 (*)    Glucose, Bld 135 (*)    Creatinine, Ser 1.06 (*)    GFR calc non Af Amer 45 (*)    GFR calc Af Amer 52 (*)    All other components within normal limits  CBC WITH DIFFERENTIAL/PLATELET - Abnormal; Notable for the following:    RBC 3.80 (*)    Hemoglobin 11.6 (*)    HCT 35.4 (*)    All other components within normal limits  URINALYSIS, ROUTINE W REFLEX MICROSCOPIC (NOT AT [facility]) - Abnormal; Notable for the following:    APPearance TURBID (*)    Nitrite POSITIVE (*)    Leukocytes, UA SMALL (*)    All other components within normal limits  URINE MICROSCOPIC-ADD ON - Abnormal; Notable for the following:    Bacteria, UA MANY (*)    All other components within normal limits  SURGICAL PCR SCREEN  PROTIME-INR  TYPE AND SCREEN  ABO/RH    Imaging Review Dg Chest 1 View  06/27/2015   CLINICAL DATA:  Larey Seat today onto left side. Preop for hip fracture. History of hypertension and kidney disease.  EXAM: CHEST  1 VIEW  COMPARISON:  11/18/2014.  FINDINGS: Lung bases partly  excluded on the field of view.  There is stable apical pleural parenchymal scarring. Lungs are hyperexpanded. No lung consolidation or edema.  There changes from CABG surgery. Cardiac silhouette is mildly enlarged. No mediastinal or hilar masses or evidence of adenopathy.  Bony thorax is demineralized.  IMPRESSION: 1. No acute cardiopulmonary disease.   Electronically Signed   By: Amie Portland M.D.   On: 06/27/2015 11:48   Dg Elbow Complete Left  06/27/2015  CLINICAL DATA:  Acute left elbow pain after fall today. Initial encounter.  EXAM: LEFT ELBOW - COMPLETE 3+ VIEW  COMPARISON:  None.  FINDINGS: There is no evidence of fracture, dislocation, or joint effusion. There is no evidence of arthropathy or other focal bone abnormality. Soft tissues are unremarkable.  IMPRESSION: No abnormality seen in the left elbow.   Electronically Signed   By: Lupita Raider, M.D.   On: 06/27/2015 11:45   Ct Head Wo Contrast  06/27/2015   CLINICAL DATA:  Larey Seat today and hit head.  EXAM: CT HEAD WITHOUT CONTRAST  CT CERVICAL SPINE WITHOUT CONTRAST  TECHNIQUE: Multidetector CT imaging of the head and cervical spine was performed following the standard protocol without intravenous contrast. Multiplanar CT image reconstructions of the cervical spine were also generated.  COMPARISON:  12/13/2014  FINDINGS: CT HEAD FINDINGS  Stable age related cerebral atrophy, ventriculomegaly and periventricular white matter disease. No extra-axial fluid collections are identified. No CT findings for acute hemispheric infarction or intracranial hemorrhage. No mass lesions. The brainstem and cerebellum are normal.  No acute skull fracture is identified. The paranasal sinuses and mastoid air cells are clear. The globes are intact.  CT CERVICAL SPINE FINDINGS  Advanced degenerative cervical spondylosis with multilevel disc disease and facet disease. No acute fractures identified. No abnormal prevertebral soft tissue swelling. The skullbase C1 and  C1-2 articulations are maintained. The facets are normally aligned. No facet or laminar fractures. The spinal canal is fairly generous and there is no significant canal compromise. Mild foraminal stenosis noted at C4-5 and C5-6 due to uncinate spurring and facet disease.  Moderate carotid artery and vertebral artery calcifications.  IMPRESSION: 1. No acute intracranial findings or skull fracture. Stable age related cerebral atrophy, ventriculomegaly and periventricular white matter disease. 2. Degenerative cervical spondylosis with multilevel disc disease and facet disease but no acute cervical spine fracture.   Electronically Signed   By: Rudie Meyer M.D.   On: 06/27/2015 11:19   Ct Cervical Spine Wo Contrast  06/27/2015   CLINICAL DATA:  Larey Seat today and hit head.  EXAM: CT HEAD WITHOUT CONTRAST  CT CERVICAL SPINE WITHOUT CONTRAST  TECHNIQUE: Multidetector CT imaging of the head and cervical spine was performed following the standard protocol without intravenous contrast. Multiplanar CT image reconstructions of the cervical spine were also generated.  COMPARISON:  12/13/2014  FINDINGS: CT HEAD FINDINGS  Stable age related cerebral atrophy, ventriculomegaly and periventricular white matter disease. No extra-axial fluid collections are identified. No CT findings for acute hemispheric infarction or intracranial hemorrhage. No mass lesions. The brainstem and cerebellum are normal.  No acute skull fracture is identified. The paranasal sinuses and mastoid air cells are clear. The globes are intact.  CT CERVICAL SPINE FINDINGS  Advanced degenerative cervical spondylosis with multilevel disc disease and facet disease. No acute fractures identified. No abnormal prevertebral soft tissue swelling. The skullbase C1 and C1-2 articulations are maintained. The facets are normally aligned. No facet or laminar fractures. The spinal canal is fairly generous and there is no significant canal compromise. Mild foraminal stenosis  noted at C4-5 and C5-6 due to uncinate spurring and facet disease.  Moderate carotid artery and vertebral artery calcifications.  IMPRESSION: 1. No acute intracranial findings or skull fracture. Stable age related cerebral atrophy, ventriculomegaly and periventricular white matter disease. 2. Degenerative cervical spondylosis with multilevel disc disease and facet disease but no acute cervical spine fracture.   Electronically Signed   By: Orlene Plum.D.  On: 06/27/2015 11:19   Dg Hip Unilat With Pelvis 2-3 Views Left  06/27/2015   CLINICAL DATA:  Patient fell today onto her left side. Severe left hip pain with limited range of motion.  EXAM: DG HIP (WITH OR WITHOUT PELVIS) 2-3V LEFT  COMPARISON:  01/21/2015  FINDINGS: There is a displaced, comminuted and central fracture of the proximal left femur. Left hip joint is normally aligned.  Primary fracture components are displaced approximately 16 mm. There is varus angulation. There is a separate fracture components of the greater and lesser trochanters. Lesser trochanter fracture is across the base and is displaced medially by 13 mm.  Bones are diffusely demineralized.  No pelvic fracture.  IMPRESSION: 1. Displaced, comminuted left proximal femur intertrochanteric fracture with varus angulation.   Electronically Signed   By: Amie Portland M.D.   On: 06/27/2015 11:43   Dg Femur Port Min 2 Views Left  06/27/2015   CLINICAL DATA:  T14.8 (ICD-10-CM) - FracturePrevious images for hip and femurPatient and family unable to tolerate more images  EXAM: LEFT FEMUR PORTABLE 2 VIEWS  COMPARISON:  01/21/2015  FINDINGS: There is an intertrochanteric fracture of the proximal left femur. There is mild displacement, of approximately 2 cm, as well as varus angulation.  No other fractures.  Knee and hip joints are normally aligned.  There is proximal left thigh and left lateral hip soft tissue swelling.  IMPRESSION: Intertrochanteric fracture of the proximal left femur.    Electronically Signed   By: Amie Portland M.D.   On: 06/27/2015 13:07     EKG Interpretation None       10:46 AM  Pt seen and examined, also seen and examined by Dr Verdie Mosher.  We have discussed the workup of this patient.    12:13 PM I spoke with Dr Roda Shutters who will see the patient.  He anticipates surgery tomorrow.    12:48 PM I have spoken with Dr Waymon Amato who will see and admit the patient.  Family members have requested Dr Magnus Ivan - I have spoken with Dr Roda Shutters who will make Dr Magnus Ivan aware.  Pt to remain NPO until Dr Magnus Ivan makes a decision regarding the timing of surgery.   MDM   Final diagnoses:  Closed left hip fracture, initial encounter    Pt with hx parkinsons disease with mechanical fall resulting in left hip fracture.  Pt admitted to [facility] with ortho consultation.  Plan is for OR this afternoon for repair.     Trixie Dredge, PA-C 06/27/15 1617  Lavera Guise, MD 06/27/15 2032

## 2015-06-27 NOTE — Progress Notes (Signed)
Patient ID: Lisa Evans, female   DOB: 10/19/1923, 81 y.o.   MRN: [id number] I have met the patient as well as her family and HPOA at the bedside.  We are recommending fixation of her left hip fracture for quality of life reasons - decreased pain and hopefully improved mobility.  They understand fully the risks and benefits involved and informed consent is obtained.

## 2015-06-27 NOTE — Transfer of Care (Signed)
Immediate Anesthesia Transfer of Care Note  Patient: Lisa Evans  Procedure(s) Performed: Procedure(s): INTRAMEDULLARY (IM) NAIL INTERTROCHANTRIC (Left)  Patient Location: PACU  Anesthesia Type:General  Level of Consciousness: awake, alert  and oriented  Airway & Oxygen Therapy: Patient Spontanous Breathing and Patient connected to nasal cannula oxygen  Post-op Assessment: Report given to RN and Post -op Vital signs reviewed and stable  Post vital signs: Reviewed and stable  Last Vitals:  Filed Vitals:   06/27/15 1715  BP: 135/99  Pulse:   Temp: 36.4 C  Resp:     Complications: No apparent anesthesia complications

## 2015-06-27 NOTE — Consult Note (Signed)
ORTHOPAEDIC CONSULTATION  REQUESTING PHYSICIAN: Lavera Guise, MD  Chief Complaint: Left hip fracture  HPI: Lisa Evans is a 81 y.o. female who presents with left hip fx s/p mechanical fall.  Pain is in left hip and severe.  Worse with movement.  No radiation.  Throbbing pain.  Denies LOC, neck pain, abd pain. Small skin tear over left elbow.  Ortho consulted.  Walks with walker at baseline.  Past Medical History  Diagnosis Date  . Arthritis   . Chicken pox   . Depression   . Frequent headaches   . GERD (gastroesophageal reflux disease)   . History of stomach ulcers   . Chronic kidney disease   . Hyperlipidemia   . Hypertension   . Urine incontinence   . Parkinson disease    Past Surgical History  Procedure Laterality Date  . Coronary artery bypass graft      x 4   Social History   Social History  . Marital Status: Widowed    Spouse Name: N/A  . Number of Children: N/A  . Years of Education: N/A   Social History Main Topics  . Smoking status: Never Smoker   . Smokeless tobacco: Never Used  . Alcohol Use: No  . Drug Use: No  . Sexual Activity: Not Currently   Other Topics Concern  . None   Social History Narrative   Lives with daughter.   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   Highest level of education:  8th grade      Family History  Problem Relation Age of Onset  . Heart disease Father   . Hypertension Father   . Cancer Sister     Breast  . Hyperlipidemia Sister   . Heart disease Sister   . Kidney disease Sister   . Hyperlipidemia Brother   . Heart disease Brother   . Cancer Son     Lung  . Kidney disease Son    Allergies  Allergen Reactions  . Penicillins Hives   Prior to Admission medications   Medication Sig Start Date End Date Taking? Authorizing Provider  acetaminophen (TYLENOL) 500 MG tablet Take 500 mg by mouth every 6 (six) hours as needed.   Yes Historical Provider, MD  aspirin 325 MG tablet Take 325 mg by mouth daily.    Yes Historical Provider, MD  atorvastatin (LIPITOR) 40 MG tablet Take 1 tablet (40 mg total) by mouth daily. Patient taking differently: Take 20 mg by mouth daily.  03/06/15  Yes Donika K Patel, DO  carbidopa-levodopa (SINEMET IR) 25-100 MG per tablet Take 1 tablet by mouth 3 (three) times daily. Patient taking differently: Take 0.5-1 tablets by mouth daily. 1/2 in the morning 1 in the evening 04/09/15  Yes Donika K Patel, DO  lisinopril (PRINIVIL,ZESTRIL) 20 MG tablet Take 0.5 tablets (10 mg total) by mouth daily. 04/24/15  Yes Lorre Munroe, NP  meclizine (ANTIVERT) 25 MG tablet Take 25 mg by mouth daily.    Yes Historical Provider, MD  metoprolol succinate (TOPROL-XL) 25 MG 24 hr tablet TAKE 1 TABLET BY MOUTH DAILY 05/20/15  Yes Lorre Munroe, NP  pantoprazole (PROTONIX) 40 MG tablet Take 1 tablet (40 mg total) by mouth daily. Patient not taking: Reported on 06/27/2015 07/20/14   Marinda Elk, MD  topiramate (TOPAMAX) 25 MG tablet Take 1 tablet (25 mg total) by mouth daily. Patient not taking: Reported on 06/27/2015 06/09/15   Glendale Chard, DO   Dg  Chest 1 View  06/27/2015   CLINICAL DATA:  Larey Seat today onto left side. Preop for hip fracture. History of hypertension and kidney disease.  EXAM: CHEST  1 VIEW  COMPARISON:  11/18/2014.  FINDINGS: Lung bases partly excluded on the field of view.  There is stable apical pleural parenchymal scarring. Lungs are hyperexpanded. No lung consolidation or edema.  There changes from CABG surgery. Cardiac silhouette is mildly enlarged. No mediastinal or hilar masses or evidence of adenopathy.  Bony thorax is demineralized.  IMPRESSION: 1. No acute cardiopulmonary disease.   Electronically Signed   By: Amie Portland M.D.   On: 06/27/2015 11:48   Dg Elbow Complete Left  06/27/2015   CLINICAL DATA:  Acute left elbow pain after fall today. Initial encounter.  EXAM: LEFT ELBOW - COMPLETE 3+ VIEW  COMPARISON:  None.  FINDINGS: There is no evidence of fracture,  dislocation, or joint effusion. There is no evidence of arthropathy or other focal bone abnormality. Soft tissues are unremarkable.  IMPRESSION: No abnormality seen in the left elbow.   Electronically Signed   By: Lupita Raider, M.D.   On: 06/27/2015 11:45   Ct Head Wo Contrast  06/27/2015   CLINICAL DATA:  Larey Seat today and hit head.  EXAM: CT HEAD WITHOUT CONTRAST  CT CERVICAL SPINE WITHOUT CONTRAST  TECHNIQUE: Multidetector CT imaging of the head and cervical spine was performed following the standard protocol without intravenous contrast. Multiplanar CT image reconstructions of the cervical spine were also generated.  COMPARISON:  12/13/2014  FINDINGS: CT HEAD FINDINGS  Stable age related cerebral atrophy, ventriculomegaly and periventricular white matter disease. No extra-axial fluid collections are identified. No CT findings for acute hemispheric infarction or intracranial hemorrhage. No mass lesions. The brainstem and cerebellum are normal.  No acute skull fracture is identified. The paranasal sinuses and mastoid air cells are clear. The globes are intact.  CT CERVICAL SPINE FINDINGS  Advanced degenerative cervical spondylosis with multilevel disc disease and facet disease. No acute fractures identified. No abnormal prevertebral soft tissue swelling. The skullbase C1 and C1-2 articulations are maintained. The facets are normally aligned. No facet or laminar fractures. The spinal canal is fairly generous and there is no significant canal compromise. Mild foraminal stenosis noted at C4-5 and C5-6 due to uncinate spurring and facet disease.  Moderate carotid artery and vertebral artery calcifications.  IMPRESSION: 1. No acute intracranial findings or skull fracture. Stable age related cerebral atrophy, ventriculomegaly and periventricular white matter disease. 2. Degenerative cervical spondylosis with multilevel disc disease and facet disease but no acute cervical spine fracture.   Electronically Signed   By:  Rudie Meyer M.D.   On: 06/27/2015 11:19   Ct Cervical Spine Wo Contrast  06/27/2015   CLINICAL DATA:  Larey Seat today and hit head.  EXAM: CT HEAD WITHOUT CONTRAST  CT CERVICAL SPINE WITHOUT CONTRAST  TECHNIQUE: Multidetector CT imaging of the head and cervical spine was performed following the standard protocol without intravenous contrast. Multiplanar CT image reconstructions of the cervical spine were also generated.  COMPARISON:  12/13/2014  FINDINGS: CT HEAD FINDINGS  Stable age related cerebral atrophy, ventriculomegaly and periventricular white matter disease. No extra-axial fluid collections are identified. No CT findings for acute hemispheric infarction or intracranial hemorrhage. No mass lesions. The brainstem and cerebellum are normal.  No acute skull fracture is identified. The paranasal sinuses and mastoid air cells are clear. The globes are intact.  CT CERVICAL SPINE FINDINGS  Advanced degenerative cervical spondylosis with  multilevel disc disease and facet disease. No acute fractures identified. No abnormal prevertebral soft tissue swelling. The skullbase C1 and C1-2 articulations are maintained. The facets are normally aligned. No facet or laminar fractures. The spinal canal is fairly generous and there is no significant canal compromise. Mild foraminal stenosis noted at C4-5 and C5-6 due to uncinate spurring and facet disease.  Moderate carotid artery and vertebral artery calcifications.  IMPRESSION: 1. No acute intracranial findings or skull fracture. Stable age related cerebral atrophy, ventriculomegaly and periventricular white matter disease. 2. Degenerative cervical spondylosis with multilevel disc disease and facet disease but no acute cervical spine fracture.   Electronically Signed   By: Rudie Meyer M.D.   On: 06/27/2015 11:19   Dg Hip Unilat With Pelvis 2-3 Views Left  06/27/2015   CLINICAL DATA:  Patient fell today onto her left side. Severe left hip pain with limited range of motion.   EXAM: DG HIP (WITH OR WITHOUT PELVIS) 2-3V LEFT  COMPARISON:  01/21/2015  FINDINGS: There is a displaced, comminuted and central fracture of the proximal left femur. Left hip joint is normally aligned.  Primary fracture components are displaced approximately 16 mm. There is varus angulation. There is a separate fracture components of the greater and lesser trochanters. Lesser trochanter fracture is across the base and is displaced medially by 13 mm.  Bones are diffusely demineralized.  No pelvic fracture.  IMPRESSION: 1. Displaced, comminuted left proximal femur intertrochanteric fracture with varus angulation.   Electronically Signed   By: Amie Portland M.D.   On: 06/27/2015 11:43    Positive ROS: All other systems have been reviewed and were otherwise negative with the exception of those mentioned in the HPI and as above.  Physical Exam: General: Alert, no acute distress Cardiovascular: No pedal edema Respiratory: No cyanosis, no use of accessory musculature GI: No organomegaly, abdomen is soft and non-tender Skin: No lesions in the area of chief complaint Neurologic: Sensation intact distally Psychiatric: Patient is competent for consent with normal mood and affect Lymphatic: No axillary or cervical lymphadenopathy  MUSCULOSKELETAL:  - pain ful movement of left leg - NVI - compartments soft  Assessment: Left IT hip fx  Plan: - hospitalist to see to medically optimize - NPO - consent obtained - foley - patient's family requested Dr. Magnus Ivan to perform surgery - surgery posted for 4 pm  Thank you for the consult and the opportunity to see Ms. Shon Hough. Glee Arvin, MD [facility] [phone] 12:54 PM

## 2015-06-27 NOTE — ED Notes (Signed)
Report given to RN on 5N.

## 2015-06-27 NOTE — H&P (Addendum)
History and Physical  Lisa Evans ZOX:[id number] DOB: 23-Jan-1923 DOA: 06/27/2015  Referring physician: Dr. Trixie Evans, EDP PCP: Lisa Reaper, Evans  Outpatient Specialists:  1. Neurology: Dr. Nita Evans 2. Nephrology: Dr. Fayrene Fearing Evans  Chief Complaint: Left hip pain following a fall at home.  HPI: Lisa Evans is a 81 y.o. female , widowed. Lives with family, ambulates with the help of a walker, extensive PMH-CAD status post CABG 1999, HTN, HLD, Parkinson's disease, mild dementia and chronic kidney disease presented to the [facility] ED on 06/27/15 following a fall at home leading to left hip fracture. Limited history obtained from patient secondary to mental status changes. History obtained from patient's daughter, Lisa Evans) and son-in-law at bedside. At baseline, patient ambulates short distances only at home utilizing the walker. She does not have any exertional dyspnea or chest pain. This morning they were planning to go out to run some errands. Patient left her walker next to the bathroom and went to her bedroom. While returning, she apparently tripped and fell on her left side. She apparently hit her head also. She developed excruciating left hip pain and was unable to get up. No loss of consciousness or overt bleeding. No chest pain, palpitations or dyspnea. EMS was called. In the ED, workup significant for hemoglobin 11.6, chest x-ray, left elbow x-ray, CT head & neck without acute findings. However x-ray of the hip/pelvis showed displaced, communited left proximal femur intertrochanteric fracture with angulation. EDP consulted orthopedics who plan for surgical fixation at 4 PM on 06/27/15. Hospitalist admission was requested.   Review of Systems: All systems reviewed and apart from history of presenting illness, are negative.  Past Medical History  Diagnosis Date  . Arthritis   . Chicken pox   . Depression   . Frequent headaches   . GERD (gastroesophageal reflux disease)     . History of stomach ulcers   . Chronic kidney disease   . Hyperlipidemia   . Hypertension   . Urine incontinence   . Parkinson disease    Past Surgical History  Procedure Laterality Date  . Coronary artery bypass graft      x 4   Social History:  reports that she has never smoked. She has never used smokeless tobacco. She reports that she does not drink alcohol or use illicit drugs. Social history as per history of present illness  Allergies  Allergen Reactions  . Penicillins Hives    Family History  Problem Relation Age of Onset  . Heart disease Father   . Hypertension Father   . Cancer Sister     Breast  . Hyperlipidemia Sister   . Heart disease Sister   . Kidney disease Sister   . Hyperlipidemia Brother   . Heart disease Brother   . Cancer Son     Lung  . Kidney disease Son     Prior to Admission medications   Medication Sig Start Date End Date Taking? Authorizing Provider  acetaminophen (TYLENOL) 500 MG tablet Take 500 mg by mouth every 6 (six) hours as needed.   Yes Historical Provider, MD  aspirin 325 MG tablet Take 325 mg by mouth daily.   Yes Historical Provider, MD  atorvastatin (LIPITOR) 40 MG tablet Take 1 tablet (40 mg total) by mouth daily. Patient taking differently: Take 20 mg by mouth daily.  03/06/15  Yes Lisa K Patel, DO  carbidopa-levodopa (SINEMET IR) 25-100 MG per tablet Take 1 tablet by mouth 3 (three) times  daily. Patient taking differently: Take 0.5-1 tablets by mouth daily. 1/2 in the morning 1 in the evening 04/09/15  Yes Lisa K Patel, DO  lisinopril (PRINIVIL,ZESTRIL) 20 MG tablet Take 0.5 tablets (10 mg total) by mouth daily. 04/24/15  Yes Lisa Evans  meclizine (ANTIVERT) 25 MG tablet Take 25 mg by mouth daily.    Yes Historical Provider, MD  metoprolol succinate (TOPROL-XL) 25 MG 24 hr tablet TAKE 1 TABLET BY MOUTH DAILY 05/20/15  Yes Lisa Evans   Physical Exam: Filed Vitals:   06/27/15 1224 06/27/15 1230 06/27/15 1245  06/27/15 1320  BP: 136/69 106/62 124/55 113/54  Pulse: 64 66 68 61  Resp: 22 21 23 25   SpO2: 92% 99% 95% 94%   temperature: 94%.   General exam: Moderately built and thinly nourished frail elderly female patient, lying comfortably supine on the gurney in no obvious distress.  Head, eyes and ENT: Nontraumatic and normocephalic. Pupils equally reacting to light and accommodation. Oral mucosa slightly dry.  Neck: Supple. No JVD, carotid bruit or thyromegaly.  Lymphatics: No lymphadenopathy.  Respiratory system: Clear to auscultation. No increased work of breathing. Unable to examine back secondary to painful left hip and not wanting to cause discomfort by turning her.  Cardiovascular system: S1 and S2 heard, RRR. No JVD, murmurs, gallops, clicks or pedal edema.  Gastrointestinal system: Abdomen is nondistended, soft and nontender. Normal bowel sounds heard. No organomegaly or masses appreciated.  Central nervous system: Alert and oriented only to self. No focal neurological deficits.  Extremities: Symmetric 5 x 5 power except left lower extremity which is shortened and externally rotated and power assessment limited secondary to pain. Peripheral pulses symmetrically felt. Superficial abrasion left elbow.  Skin: No rashes or acute findings.  Musculoskeletal system: Negative exam.  Psychiatry: Pleasant and cooperative.   Labs on Admission:  Basic Metabolic Panel:  Recent Labs Lab 06/27/15 1039  NA 137  K 3.9  CL 99*  CO2 28  GLUCOSE 135*  BUN 16  CREATININE 1.06*  CALCIUM 9.9   Liver Function Tests: No results for input(s): AST, ALT, ALKPHOS, BILITOT, PROT, ALBUMIN in the last 168 hours. No results for input(s): LIPASE, AMYLASE in the last 168 hours. No results for input(s): AMMONIA in the last 168 hours. CBC:  Recent Labs Lab 06/27/15 1039  WBC 6.3  NEUTROABS 4.7  HGB 11.6*  HCT 35.4*  MCV 93.2  PLT 194   Cardiac Enzymes: No results for input(s): CKTOTAL,  CKMB, CKMBINDEX, TROPONINI in the last 168 hours.  BNP (last 3 results) No results for input(s): PROBNP in the last 8760 hours. CBG: No results for input(s): GLUCAP in the last 168 hours.  Radiological Exams on Admission: Dg Chest 1 View  06/27/2015   CLINICAL DATA:  Larey Seat today onto left side. Preop for hip fracture. History of hypertension and kidney disease.  EXAM: CHEST  1 VIEW  COMPARISON:  11/18/2014.  FINDINGS: Lung bases partly excluded on the field of view.  There is stable apical pleural parenchymal scarring. Lungs are hyperexpanded. No lung consolidation or edema.  There changes from CABG surgery. Cardiac silhouette is mildly enlarged. No mediastinal or hilar masses or evidence of adenopathy.  Bony thorax is demineralized.  IMPRESSION: 1. No acute cardiopulmonary disease.   Electronically Signed   By: Amie Portland M.D.   On: 06/27/2015 11:48   Dg Elbow Complete Left  06/27/2015   CLINICAL DATA:  Acute left elbow pain after fall today. Initial  encounter.  EXAM: LEFT ELBOW - COMPLETE 3+ VIEW  COMPARISON:  None.  FINDINGS: There is no evidence of fracture, dislocation, or joint effusion. There is no evidence of arthropathy or other focal bone abnormality. Soft tissues are unremarkable.  IMPRESSION: No abnormality seen in the left elbow.   Electronically Signed   By: Lupita Raider, M.D.   On: 06/27/2015 11:45   Ct Head Wo Contrast  06/27/2015   CLINICAL DATA:  Larey Seat today and hit head.  EXAM: CT HEAD WITHOUT CONTRAST  CT CERVICAL SPINE WITHOUT CONTRAST  TECHNIQUE: Multidetector CT imaging of the head and cervical spine was performed following the standard protocol without intravenous contrast. Multiplanar CT image reconstructions of the cervical spine were also generated.  COMPARISON:  12/13/2014  FINDINGS: CT HEAD FINDINGS  Stable age related cerebral atrophy, ventriculomegaly and periventricular white matter disease. No extra-axial fluid collections are identified. No CT findings for acute  hemispheric infarction or intracranial hemorrhage. No mass lesions. The brainstem and cerebellum are normal.  No acute skull fracture is identified. The paranasal sinuses and mastoid air cells are clear. The globes are intact.  CT CERVICAL SPINE FINDINGS  Advanced degenerative cervical spondylosis with multilevel disc disease and facet disease. No acute fractures identified. No abnormal prevertebral soft tissue swelling. The skullbase C1 and C1-2 articulations are maintained. The facets are normally aligned. No facet or laminar fractures. The spinal canal is fairly generous and there is no significant canal compromise. Mild foraminal stenosis noted at C4-5 and C5-6 due to uncinate spurring and facet disease.  Moderate carotid artery and vertebral artery calcifications.  IMPRESSION: 1. No acute intracranial findings or skull fracture. Stable age related cerebral atrophy, ventriculomegaly and periventricular white matter disease. 2. Degenerative cervical spondylosis with multilevel disc disease and facet disease but no acute cervical spine fracture.   Electronically Signed   By: Rudie Meyer M.D.   On: 06/27/2015 11:19   Ct Cervical Spine Wo Contrast  06/27/2015   CLINICAL DATA:  Larey Seat today and hit head.  EXAM: CT HEAD WITHOUT CONTRAST  CT CERVICAL SPINE WITHOUT CONTRAST  TECHNIQUE: Multidetector CT imaging of the head and cervical spine was performed following the standard protocol without intravenous contrast. Multiplanar CT image reconstructions of the cervical spine were also generated.  COMPARISON:  12/13/2014  FINDINGS: CT HEAD FINDINGS  Stable age related cerebral atrophy, ventriculomegaly and periventricular white matter disease. No extra-axial fluid collections are identified. No CT findings for acute hemispheric infarction or intracranial hemorrhage. No mass lesions. The brainstem and cerebellum are normal.  No acute skull fracture is identified. The paranasal sinuses and mastoid air cells are clear. The  globes are intact.  CT CERVICAL SPINE FINDINGS  Advanced degenerative cervical spondylosis with multilevel disc disease and facet disease. No acute fractures identified. No abnormal prevertebral soft tissue swelling. The skullbase C1 and C1-2 articulations are maintained. The facets are normally aligned. No facet or laminar fractures. The spinal canal is fairly generous and there is no significant canal compromise. Mild foraminal stenosis noted at C4-5 and C5-6 due to uncinate spurring and facet disease.  Moderate carotid artery and vertebral artery calcifications.  IMPRESSION: 1. No acute intracranial findings or skull fracture. Stable age related cerebral atrophy, ventriculomegaly and periventricular white matter disease. 2. Degenerative cervical spondylosis with multilevel disc disease and facet disease but no acute cervical spine fracture.   Electronically Signed   By: Rudie Meyer M.D.   On: 06/27/2015 11:19   Dg Hip Unilat With Pelvis  2-3 Views Left  06/27/2015   CLINICAL DATA:  Patient fell today onto her left side. Severe left hip pain with limited range of motion.  EXAM: DG HIP (WITH OR WITHOUT PELVIS) 2-3V LEFT  COMPARISON:  01/21/2015  FINDINGS: There is a displaced, comminuted and central fracture of the proximal left femur. Left hip joint is normally aligned.  Primary fracture components are displaced approximately 16 mm. There is varus angulation. There is a separate fracture components of the greater and lesser trochanters. Lesser trochanter fracture is across the base and is displaced medially by 13 mm.  Bones are diffusely demineralized.  No pelvic fracture.  IMPRESSION: 1. Displaced, comminuted left proximal femur intertrochanteric fracture with varus angulation.   Electronically Signed   By: Amie Portland M.D.   On: 06/27/2015 11:43   Dg Femur Port Min 2 Views Left  06/27/2015   CLINICAL DATA:  T14.8 (ICD-10-CM) - FracturePrevious images for hip and femurPatient and family unable to  tolerate more images  EXAM: LEFT FEMUR PORTABLE 2 VIEWS  COMPARISON:  01/21/2015  FINDINGS: There is an intertrochanteric fracture of the proximal left femur. There is mild displacement, of approximately 2 cm, as well as varus angulation.  No other fractures.  Knee and hip joints are normally aligned.  There is proximal left thigh and left lateral hip soft tissue swelling.  IMPRESSION: Intertrochanteric fracture of the proximal left femur.   Electronically Signed   By: Amie Portland M.D.   On: 06/27/2015 13:07    EKG: Not seen in epic. Will order.  Assessment/Plan Principal Problem:   Hip fracture Active Problems:   HTN (hypertension)   HLD (hyperlipidemia)   CAD (coronary artery disease)   Parkinson's disease   Dementia   Closed left hip fracture   1. Closed left hip fracture (intertrochanteric fracture of the proximal left femur): Sustained post mechanical fall at home. Orthopedics have been consulted by EDP and plan surgical intervention on 06/27/15 at 4 PM. Based on available data, patient is at low risk for perioperative cardiac events. May proceed with indicated surgery without any further cardiac evaluation with usual close perioperative monitoring. We will give home dose of beta blockers preoperatively. Postoperative activity/weightbearing, pain management, DVT prophylaxis and wound care as per orthopedic service. Will likely need SNF at DC. 2. Mechanical fall at home: May be complicated by advanced age, frail status, dementia and Parkinson's disease. 3. Essential hypertension: Controlled. Continue beta blockers including preoperatively today. Resume lisinopril from tomorrow. 4. HLD: Continue statins 5. Parkinson's disease: Continue home medications. 6. Mild dementia: Mental status probably at baseline. 7. CAD status post CABG: No cardiac symptoms with limited activity at home. Continue aspirin, beta blockers and statins. Echo 07/20/14: LVEF 50% and akinesis of basal/mid inferolateral  myocardium.  8. Stage III chronic kidney disease: Creatinine normal. Follow postop. 9. Anemia: Stable. Follow postop.    DVT prophylaxis: Currently SCDs. Postoperative as per orthopedic service. Code Status: Full-confirmed with patient's daughter/healthcare power of attorney and son-in-law at bedside. Family Communication: Discussed with patient's daughter, son-in-law and pastor at bedside.  Disposition Plan: DC possibly to SNF when medically stable, possibly in the next 3-4 days.   Time spent: 60 minutes.  Marcellus Scott, MD, FACP, FHM. [facility] Pager [phone]  If 7PM-7AM, please contact night-coverage [email or web address] Password [facility] 06/27/2015, 1:45 PM  Addendum  EKG: Sinus rhythm, normal axis, LVH and no acute findings. QTC 468 ms.  Marcellus Scott, MD, FACP, FHM. [facility] Pager [phone]  If 7PM-7AM, please contact night-coverage  [email or web address] Password [email or web address] 06/27/2015, 3:11 PM

## 2015-06-27 NOTE — Progress Notes (Signed)
Called report to short stay  

## 2015-06-27 NOTE — ED Notes (Signed)
PA [facility] at bedside with patient and family.   

## 2015-06-27 NOTE — Anesthesia Procedure Notes (Signed)
Procedure Name: Intubation Date/Time: 06/27/2015 3:59 PM Performed by: Carmela Rima Pre-anesthesia Checklist: Timeout performed, Patient being monitored, Suction available, Emergency Drugs available and Patient identified Patient Re-evaluated:Patient Re-evaluated prior to inductionOxygen Delivery Method: Circle system utilized Preoxygenation: Pre-oxygenation with 100% oxygen Intubation Type: IV induction Ventilation: Mask ventilation without difficulty Laryngoscope Size: Mac and 3 Grade View: Grade I Tube type: Oral Tube size: 7.0 mm Number of attempts: 1 Placement Confirmation: breath sounds checked- equal and bilateral,  positive ETCO2 and ETT inserted through vocal cords under direct vision Secured at: 22 cm Tube secured with: Tape Dental Injury: Teeth and Oropharynx as per pre-operative assessment

## 2015-06-28 DIAGNOSIS — I10 Essential (primary) hypertension: Secondary | ICD-10-CM

## 2015-06-28 DIAGNOSIS — I2510 Atherosclerotic heart disease of native coronary artery without angina pectoris: Secondary | ICD-10-CM

## 2015-06-28 DIAGNOSIS — E785 Hyperlipidemia, unspecified: Secondary | ICD-10-CM

## 2015-06-28 DIAGNOSIS — S72002D Fracture of unspecified part of neck of left femur, subsequent encounter for closed fracture with routine healing: Secondary | ICD-10-CM

## 2015-06-28 DIAGNOSIS — N179 Acute kidney failure, unspecified: Secondary | ICD-10-CM

## 2015-06-28 DIAGNOSIS — G20 Parkinson's disease: Secondary | ICD-10-CM

## 2015-06-28 DIAGNOSIS — R1314 Dysphagia, pharyngoesophageal phase: Secondary | ICD-10-CM

## 2015-06-28 DIAGNOSIS — N183 Chronic kidney disease, stage 3 (moderate): Secondary | ICD-10-CM

## 2015-06-28 LAB — BASIC METABOLIC PANEL
Anion gap: 10 (ref 5–15)
BUN: 24 mg/dL — ABNORMAL HIGH (ref 6–20)
CO2: 23 mmol/L (ref 22–32)
Calcium: 8.2 mg/dL — ABNORMAL LOW (ref 8.9–10.3)
Chloride: 100 mmol/L — ABNORMAL LOW (ref 101–111)
Creatinine, Ser: 1.94 mg/dL — ABNORMAL HIGH (ref 0.44–1.00)
GFR calc Af Amer: 25 mL/min — ABNORMAL LOW (ref >60)
GFR calc non Af Amer: 22 mL/min — ABNORMAL LOW (ref >60)
GLUCOSE: 241 mg/dL — AB (ref 65–99)
Potassium: 4.9 mmol/L (ref 3.5–5.1)
SODIUM: 133 mmol/L — AB (ref 135–145)

## 2015-06-28 LAB — CBC
HEMATOCRIT: 21 % — AB (ref 36.0–46.0)
Hemoglobin: 7 g/dL — ABNORMAL LOW (ref 12.0–15.0)
MCH: 30.2 pg (ref 26.0–34.0)
MCHC: 33.3 g/dL (ref 30.0–36.0)
MCV: 90.5 fL (ref 78.0–100.0)
PLATELETS: 150 10*3/uL (ref 150–400)
RBC: 2.32 MIL/uL — ABNORMAL LOW (ref 3.87–5.11)
RDW: 12.9 % (ref 11.5–15.5)
WBC: 10 10*3/uL (ref 4.0–10.5)

## 2015-06-28 MED ORDER — SODIUM CHLORIDE 0.9 % IV BOLUS (SEPSIS)
250.0000 mL | Freq: Once | INTRAVENOUS | Status: AC
  Administered 2015-06-28: 250 mL via INTRAVENOUS

## 2015-06-28 MED ORDER — HYDROCODONE-ACETAMINOPHEN 5-325 MG PO TABS
1.0000 | ORAL_TABLET | Freq: Four times a day (QID) | ORAL | Status: AC | PRN
Start: 2015-06-28 — End: ?

## 2015-06-28 NOTE — Progress Notes (Signed)
Physical Therapy Treatment Patient Details Name: Lisa Evans MRN: [id number] DOB: July 25, 1923 Today's Date: 06/28/2015    History of Present Illness Pt is a 81 y/o female who lives with family and at baseline ambulates with RW. She has an extensive PMH of CAD s/p CABG in 1999, HTN, HLD, Parkinson's, dementia, CKD. Pt presented to the [facility] on 8/12 following a fall at home leading to a L displaced, communited proximal femur intertrochanteric fracture with angulation. S/P ORIF and IM nailing on 8/12.     PT Comments    Pt returned to assist pt with transfer chair>bed, educate staff on transfer techniques, and answer questions for family. Pt was able to transition back to bed with +2 assist and bed pad for extra support under hips. Pt with increased pain and anxiety when first attempting to bring feet down to floor. LE's were held outright and pt was assisted into forward trunk flexion as feet were gently lowered to the floor. Will continue to follow and progress as able per POC.   Follow Up Recommendations  SNF;Supervision/Assistance - 24 hour     Equipment Recommendations  Wheelchair (measurements PT);Wheelchair cushion (measurements PT)    Recommendations for Other Services       Precautions / Restrictions Precautions Precautions: Fall Precaution Comments: Hit L side during fall - L UE unjury as well per daughter, however pt did not report pain during session.  Restrictions Weight Bearing Restrictions: Yes LLE Weight Bearing: Weight bearing as tolerated    Mobility  Bed Mobility Overal bed mobility: Needs Assistance Bed Mobility: Sit to Supine       Sit to supine: Total assist;+2 for physical assistance   General bed mobility comments: Pt with increased anxiety when beginning to transition to supine. LLE internally rotating upon laying flat in bed.   Transfers Overall transfer level: Needs assistance Equipment used: 2 person hand held assist Transfers: Sit to/from  [employer] Sit to Stand: Total assist;+2 physical assistance   Squat pivot transfers: Total assist;+2 physical assistance     General transfer comment: Bed pad used to clear hips from chair and squat pivot pt to bed. Appeared very anxious and fearful of falling during transfer.   Ambulation/Gait             General Gait Details: Unable at this time.    Stairs            Wheelchair Mobility    Modified Rankin (Stroke Patients Only)       Balance Overall balance assessment: History of Falls                                  Cognition Arousal/Alertness: Awake/alert Behavior During Therapy: Flat affect Overall Cognitive Status: Impaired/Different from baseline Area of Impairment: Attention;Following commands;Safety/judgement;Awareness;Problem solving   Current Attention Level: Focused Memory: Decreased short-term memory Following Commands: Follows one step commands inconsistently;Follows one step commands with increased time Safety/Judgement: Decreased awareness of deficits Awareness: Intellectual Problem Solving: Slow processing;Decreased initiation;Difficulty sequencing General Comments: Pt does not say much during session. Answered simple questions with one word answers    Exercises      General Comments        Pertinent Vitals/Pain Pain Assessment: Faces Faces Pain Scale: Hurts little more Pain Location: LLE during mobility Pain Descriptors / Indicators: Grimacing;Guarding Pain Intervention(s): Limited activity within patient's tolerance;Monitored during session;Repositioned    Home Living  Prior Function            PT Goals (current goals can now be found in the care plan section) Acute Rehab PT Goals PT Goal Formulation: With family Time For Goal Achievement: 07/12/15 Potential to Achieve Goals: Fair Progress towards PT goals: Not progressing toward goals - comment (Pain and altered  mental status are limiting factors)    Frequency  Min 3X/week    PT Plan Current plan remains appropriate    Co-evaluation             End of Session Equipment Utilized During Treatment: Gait belt;Oxygen Activity Tolerance: Patient limited by pain Patient left: in bed;with call bell/phone within reach;with nursing/sitter in room;with family/visitor present     Time: 0454-0981 PT Time Calculation (min) (ACUTE ONLY): 12 min  Charges:  $Therapeutic Activity: 8-22 mins                    G Codes:      Conni Slipper 2015-07-11, 2:54 PM   Conni Slipper, PT, DPT Acute Rehabilitation Services Pager: [phone]

## 2015-06-28 NOTE — Progress Notes (Signed)
[facility] PROGRESS NOTE  Lisa Evans ZOX:[id number] DOB: 1922-12-16 DOA: 06/27/2015 PCP: Nicki Reaper, NP  Assessment/Plan: 1-Closed left hip fracture: s/p internal fixation on 8/12 -will follow ortho rec's as part of post operative course -PT/OT to assess Capacity to performed ADL and need of assistance  -will most likely need SNF  2-HTN: will continue current antihypertensive agents. Except for lisinopril  3-acute on chronic renal failure: due to hypotension and decrease perfusion; also as patient continued on lisinopril. Stage 3-4 at baseline -will stop/avoid nephrotoxic agents -will d/c lisinopril -Cr 1.94 -follow BMET  4-Anemia: of chronic disease -no overt bleeding -will monitor Hgb trend  5-Parkinson's disease: will continue home medications  6-difficulty swallowing: associated with parkinson's most likely -will ask SLP to evaluate -dysphagia 2 nectar thick while waiting evaluation.  7-CAD status post CABG: No cardiac symptoms with limited activity at home. Continue aspirin, beta blockers and statins. Echo 07/20/14: LVEF 50% and akinesis of basal/mid inferolateral myocardium  Code: Full Family Communication: daughter at bedside Disposition Plan: will most likely SNF at discharge. PT/O ask to see patient for assessmn   Consultants:  Orthopedic service (Dr. Magnus Ivan)  Procedures:  internal fixation for intertrochanteric hip fracture.  Antibiotics: None   HPI/Subjective: In no acute distress, afebrile and of CP or SOB  Objective: Filed Vitals:   06/28/15 0410  BP: 146/46  Pulse: 75  Temp: 97.5 F (36.4 C)  Resp: 16    Intake/Output Summary (Last 24 hours) at 06/28/15 1401 Last data filed at 06/28/15 0900  Gross per 24 hour  Intake    915 ml  Output    475 ml  Net    440 ml    Exam:   General:  Afbrile, reports pain to be mild and well controlled with current meds. Denies CP.  Cardiovascular: S1 and S2, no rubs, no gallops, positive  murmur   Respiratory: CTA bilaterally  Abdomen: soft, ND. Positive BS  Musculoskeletal: no cyanosis or clubbing.  Data Reviewed: Basic Metabolic Panel:  Recent Labs Lab 06/27/15 1039 06/28/15 0533  NA 137 133*  K 3.9 4.9  CL 99* 100*  CO2 28 23  GLUCOSE 135* 241*  BUN 16 24*  CREATININE 1.06* 1.94*  CALCIUM 9.9 8.2*   CBC:  Recent Labs Lab 06/27/15 1039  WBC 6.3  NEUTROABS 4.7  HGB 11.6*  HCT 35.4*  MCV 93.2  PLT 194   CBG: No results for input(s): GLUCAP in the last 168 hours.  Recent Results (from the past 240 hour(s))  Surgical pcr screen     Status: Abnormal   Collection Time: 06/27/15  2:33 PM  Result Value Ref Range Status   MRSA, PCR NEGATIVE NEGATIVE Final   Staphylococcus aureus POSITIVE (A) NEGATIVE Final    Comment:        The Xpert SA Assay (FDA approved for NASAL specimens in patients over 38 years of age), is one component of a comprehensive surveillance program.  Test performance has been validated by [facility] for patients greater than or equal to 71 year old. It is not intended to diagnose infection nor to guide or monitor treatment.      Studies: Dg Chest 1 View  06/27/2015   CLINICAL DATA:  Larey Seat today onto left side. Preop for hip fracture. History of hypertension and kidney disease.  EXAM: CHEST  1 VIEW  COMPARISON:  11/18/2014.  FINDINGS: Lung bases partly excluded on the field of view.  There is stable apical pleural parenchymal scarring. Lungs  are hyperexpanded. No lung consolidation or edema.  There changes from CABG surgery. Cardiac silhouette is mildly enlarged. No mediastinal or hilar masses or evidence of adenopathy.  Bony thorax is demineralized.  IMPRESSION: 1. No acute cardiopulmonary disease.   Electronically Signed   By: Amie Portland M.D.   On: 06/27/2015 11:48   Dg Elbow Complete Left  06/27/2015   CLINICAL DATA:  Acute left elbow pain after fall today. Initial encounter.  EXAM: LEFT ELBOW - COMPLETE 3+ VIEW   COMPARISON:  None.  FINDINGS: There is no evidence of fracture, dislocation, or joint effusion. There is no evidence of arthropathy or other focal bone abnormality. Soft tissues are unremarkable.  IMPRESSION: No abnormality seen in the left elbow.   Electronically Signed   By: Lupita Raider, M.D.   On: 06/27/2015 11:45   Ct Head Wo Contrast  06/27/2015   CLINICAL DATA:  Larey Seat today and hit head.  EXAM: CT HEAD WITHOUT CONTRAST  CT CERVICAL SPINE WITHOUT CONTRAST  TECHNIQUE: Multidetector CT imaging of the head and cervical spine was performed following the standard protocol without intravenous contrast. Multiplanar CT image reconstructions of the cervical spine were also generated.  COMPARISON:  12/13/2014  FINDINGS: CT HEAD FINDINGS  Stable age related cerebral atrophy, ventriculomegaly and periventricular white matter disease. No extra-axial fluid collections are identified. No CT findings for acute hemispheric infarction or intracranial hemorrhage. No mass lesions. The brainstem and cerebellum are normal.  No acute skull fracture is identified. The paranasal sinuses and mastoid air cells are clear. The globes are intact.  CT CERVICAL SPINE FINDINGS  Advanced degenerative cervical spondylosis with multilevel disc disease and facet disease. No acute fractures identified. No abnormal prevertebral soft tissue swelling. The skullbase C1 and C1-2 articulations are maintained. The facets are normally aligned. No facet or laminar fractures. The spinal canal is fairly generous and there is no significant canal compromise. Mild foraminal stenosis noted at C4-5 and C5-6 due to uncinate spurring and facet disease.  Moderate carotid artery and vertebral artery calcifications.  IMPRESSION: 1. No acute intracranial findings or skull fracture. Stable age related cerebral atrophy, ventriculomegaly and periventricular white matter disease. 2. Degenerative cervical spondylosis with multilevel disc disease and facet disease but  no acute cervical spine fracture.   Electronically Signed   By: Rudie Meyer M.D.   On: 06/27/2015 11:19   Ct Cervical Spine Wo Contrast  06/27/2015   CLINICAL DATA:  Larey Seat today and hit head.  EXAM: CT HEAD WITHOUT CONTRAST  CT CERVICAL SPINE WITHOUT CONTRAST  TECHNIQUE: Multidetector CT imaging of the head and cervical spine was performed following the standard protocol without intravenous contrast. Multiplanar CT image reconstructions of the cervical spine were also generated.  COMPARISON:  12/13/2014  FINDINGS: CT HEAD FINDINGS  Stable age related cerebral atrophy, ventriculomegaly and periventricular white matter disease. No extra-axial fluid collections are identified. No CT findings for acute hemispheric infarction or intracranial hemorrhage. No mass lesions. The brainstem and cerebellum are normal.  No acute skull fracture is identified. The paranasal sinuses and mastoid air cells are clear. The globes are intact.  CT CERVICAL SPINE FINDINGS  Advanced degenerative cervical spondylosis with multilevel disc disease and facet disease. No acute fractures identified. No abnormal prevertebral soft tissue swelling. The skullbase C1 and C1-2 articulations are maintained. The facets are normally aligned. No facet or laminar fractures. The spinal canal is fairly generous and there is no significant canal compromise. Mild foraminal stenosis noted at C4-5 and C5-6  due to uncinate spurring and facet disease.  Moderate carotid artery and vertebral artery calcifications.  IMPRESSION: 1. No acute intracranial findings or skull fracture. Stable age related cerebral atrophy, ventriculomegaly and periventricular white matter disease. 2. Degenerative cervical spondylosis with multilevel disc disease and facet disease but no acute cervical spine fracture.   Electronically Signed   By: Rudie Meyer M.D.   On: 06/27/2015 11:19   Dg C-arm 1-60 Min  06/27/2015   CLINICAL DATA:  Left intertrochanteric fracture  EXAM: DG C-ARM  61-120 MIN; LEFT FEMUR 2 VIEWS  COMPARISON:  None  FLUOROSCOPY TIME:  1 minutes 47 seconds  Four images  FINDINGS: ORIF left intertrochanteric fracture transfixed with an intra medullary nail and 2 interlocking cannulated femoral neck screws. Near anatomic alignment. Lesser trochanteric fragment is medially displaced. No hip dislocation.  IMPRESSION: Interval ORIF left intertrochanteric fracture.   Electronically Signed   By: Elige Ko   On: 06/27/2015 17:31   Dg Hip Unilat With Pelvis 2-3 Views Left  06/27/2015   CLINICAL DATA:  Patient fell today onto her left side. Severe left hip pain with limited range of motion.  EXAM: DG HIP (WITH OR WITHOUT PELVIS) 2-3V LEFT  COMPARISON:  01/21/2015  FINDINGS: There is a displaced, comminuted and central fracture of the proximal left femur. Left hip joint is normally aligned.  Primary fracture components are displaced approximately 16 mm. There is varus angulation. There is a separate fracture components of the greater and lesser trochanters. Lesser trochanter fracture is across the base and is displaced medially by 13 mm.  Bones are diffusely demineralized.  No pelvic fracture.  IMPRESSION: 1. Displaced, comminuted left proximal femur intertrochanteric fracture with varus angulation.   Electronically Signed   By: Amie Portland M.D.   On: 06/27/2015 11:43   Dg Femur Min 2 Views Left  06/27/2015   CLINICAL DATA:  Left intertrochanteric fracture  EXAM: DG C-ARM 61-120 MIN; LEFT FEMUR 2 VIEWS  COMPARISON:  None  FLUOROSCOPY TIME:  1 minutes 47 seconds  Four images  FINDINGS: ORIF left intertrochanteric fracture transfixed with an intra medullary nail and 2 interlocking cannulated femoral neck screws. Near anatomic alignment. Lesser trochanteric fragment is medially displaced. No hip dislocation.  IMPRESSION: Interval ORIF left intertrochanteric fracture.   Electronically Signed   By: Elige Ko   On: 06/27/2015 17:31   Dg Femur Port Min 2 Views Left  06/27/2015    CLINICAL DATA:  T14.8 (ICD-10-CM) - FracturePrevious images for hip and femurPatient and family unable to tolerate more images  EXAM: LEFT FEMUR PORTABLE 2 VIEWS  COMPARISON:  01/21/2015  FINDINGS: There is an intertrochanteric fracture of the proximal left femur. There is mild displacement, of approximately 2 cm, as well as varus angulation.  No other fractures.  Knee and hip joints are normally aligned.  There is proximal left thigh and left lateral hip soft tissue swelling.  IMPRESSION: Intertrochanteric fracture of the proximal left femur.   Electronically Signed   By: Amie Portland M.D.   On: 06/27/2015 13:07    Scheduled Meds: . aspirin EC  325 mg Oral Q breakfast  . atorvastatin  20 mg Oral Daily  . carbidopa-levodopa  0.5-1 tablet Oral Daily  . Chlorhexidine Gluconate Cloth  6 each Topical Daily  . lisinopril  10 mg Oral Daily  . meclizine  25 mg Oral Daily  . metoprolol succinate  25 mg Oral Daily  . mupirocin ointment  1 application Nasal BID  Continuous Infusions: . sodium chloride 10 mL/hr at 06/27/15 1458  . sodium chloride 10 mL/hr at 06/28/15 1610    Principal Problem:   Hip fracture Active Problems:   HTN (hypertension)   HLD (hyperlipidemia)   CAD (coronary artery disease)   Parkinson's disease   Dementia   Closed left hip fracture    Time spent: 30 minutes    Vassie Loll  [facility] Pager [phone]. If 7PM-7AM, please contact night-coverage at [email or web address], password [facility] 06/28/2015, 2:01 PM  LOS: 1 day

## 2015-06-28 NOTE — Op Note (Signed)
NAMESTEPHANI, Lisa Evans                ACCOUNT NO.:  [id number]  MEDICAL RECORD NO.:  [id number]  LOCATION:  5N01C                        FACILITY:  [facility]  PHYSICIAN:  Vanita Panda. Magnus Ivan, M.D.DATE OF BIRTH:  06/20/1923  DATE OF PROCEDURE:  06/27/2015 DATE OF DISCHARGE:                              OPERATIVE REPORT   PREOPERATIVE DIAGNOSIS:  Left comminuted intertrochanteric hip fracture/proximal femur fracture.  POSTOPERATIVE DIAGNOSIS:  Left comminuted intertrochanteric hip fracture/proximal femur fracture.  PROCEDURE:  Open reduction and internal fixation of left hip fracture, using Smith and Nephew 10 x 36 IM nail with 85 and 80 mL lag screws (insert hand).  SURGEON:  Vanita Panda. Magnus Ivan, M.D.  ASSISTANT:  Richardean Canal, PA-C  ANESTHESIA:  General.  ANTIBIOTICS:  A 2 g IV Ancef.  BLOOD LOSS:  150 mL.  COMPLICATIONS:  None.  INDICATIONS:  Ms. Evans is a 81 year old patient, who lives with her daughters, who is her healthcare power of attorney.  She has some mild dementia and ambulates with a walker.  She had a mechanical fall accidentally today, when she tripped in her house and had debilitating left hip pain.  She was seen to [facility] Emergency Room and found to have intertrochanteric hip fracture.  She was admitted to the Medicine Service and cleared for surgery.  She is off load intermediate risk, given her comorbidities, but I have told to the family in detail and they understands that this is no matter what the higher risk given her age.  They understand our goals are decreased pain, improved mobility, and therefore approved quality of life.  We are trying to hold off on her developing bedsores and pneumonia and other sequela of the non- operated left hip.  Her hip fracture is significantly comminuted and we did go over her x-rays as well and talked in detail about the goals of surgery.  PROCEDURE DESCRIPTION:  After informed consent was obtained,  appropriate left hip was marked.  She was brought to the operating room and general anesthesia was obtained while she was on a stretcher.  A Foley catheter was ordered and placed.  Next, she was placed supine on the fracture table with her left operative leg in inline skeletal traction, and her right hip and leg abducted and placed in a stirrup out of the field with appropriate protection of the peroneal and popliteal areas.  There was a perineal post in place as well.  We then assessed her fracture under fluoroscopy, with traction and internal rotation.  We adequately reduced the fracture.  We then kept our Smith and Nephew intramedullary nails sterile in their box.  We did hold them above the leg under C-arm, so that we gain our length, which we then chose a 10 x 36 nail.  This could be then passed to the back table that was sterile and opened sterilely. We then prepped our left hip area with DuraPrep and sterile drapes.  A time-out was called.  She was identified as correct patient and correct left hip.  We then made an incision proximal to the greater trochanter and dissected down to the tip of greater trochanter.  A temporary guide pin was then  placed in antegrade fashion and reamer was inserted over this to open up the femoral canal.  We then placed our rod in antegrade fashion without needing to ream all the way down the femoral canal. Using the outrigger guide, we made a separate incision on the lateral thigh and then we were able to place temporary guide pins and then drill to that depth of 85 and 80 mm lag screws for the inner 10 component, which then compressed the fracture.  We watched this all under direct visualization and fluoroscopy.  We then removed the outrigger guide, irrigated both incisions with normal saline solution, closed the deep tissue with 0 Vicryl, followed by 2-0 Vicryl in subcutaneous tissue with interrupted staples on the skin.  Well-padded sterile dressings  applied. She was taken off the fracture table, awakened, extubated, and taken to the recovery room in stable condition.  All final counts were correct. There were no complications noted.  Of note, Richardean Canal, PA-C is assistant in the entire case.  His assistance was crucial for facilitating all aspects of this case.     Vanita Panda. Magnus Ivan, M.D.     CYB/MEDQ  D:  06/27/2015  T:  06/28/2015  Job:  [id number]

## 2015-06-28 NOTE — Progress Notes (Signed)
Patient ID: Lisa Evans, female   DOB: Jul 20, 1923, 81 y.o.   MRN: [id number] Postoperative day 1 status post internal fixation for intertrochanteric hip fracture. Patient is resting comfortably this morning with family at bedside. Discussed the importance of fluid and nutrition intake. Anticipate discharge to skilled nursing.

## 2015-06-28 NOTE — Discharge Instructions (Signed)
Can attempt full weight bearing with assistance on her left leg. Can place a new dry dressing daily over her left hip incisions as needed. Can get incisions wet in the shower.

## 2015-06-28 NOTE — Progress Notes (Signed)
Pt have 75 ml of urine output within 9 hrs. Bladder scan showed 92 ml of urine. On-call was notified and  an order of 250 ml of NS bolus was received. Will monitor.

## 2015-06-28 NOTE — Evaluation (Signed)
Physical Therapy Evaluation Patient Details Name: Lisa Evans MRN: [id number] DOB: 01-28-1923 Today's Date: 06/28/2015   History of Present Illness  Pt is a 81 y/o female who lives with family and at baseline ambulates with RW. She has an extensive PMH of CAD s/p CABG in 1999, HTN, HLD, Parkinson's, dementia, CKD. Pt presented to the [facility] on 8/12 following a fall at home leading to a L displaced, communited proximal femur intertrochanteric fracture with angulation. S/P ORIF and IM nailing on 8/12.   Clinical Impression  This patient presents with acute pain and decreased functional independence following the above mentioned procedure. At the time of PT eval, pt required +2 total assist for transfer OOB to chair. Pain and anxiety became a limiting factor during mobility, however throughout the rest of session pt did not appear anxious. Discussed d/c plan with daughter who was present during session, and she is agreeable to SNF for continued rehab. This patient is appropriate for skilled PT interventions to address functional limitations, improve safety and independence with functional mobility, and return to PLOF.     Follow Up Recommendations SNF;Supervision/Assistance - 24 hour    Equipment Recommendations  Wheelchair (measurements PT);Wheelchair cushion (measurements PT)    Recommendations for Other Services       Precautions / Restrictions Precautions Precautions: Fall Precaution Comments: Hit L side during fall - L UE unjury as well per daughter, however pt did not report pain during session.  Restrictions Weight Bearing Restrictions: Yes LLE Weight Bearing: Weight bearing as tolerated      Mobility  Bed Mobility Overal bed mobility: Needs Assistance Bed Mobility: Supine to Sit     Supine to sit: Total assist;+2 for physical assistance     General bed mobility comments: Assist with bed pad for scooting to EOB. Pt initiated movement of the RLE towards EOB but was unable with  the LLE. Max assist for trunk elevation to full sitting position.   Transfers Overall transfer level: Needs assistance Equipment used: 2 person hand held assist Transfers: Sit to/from [employer] Sit to Stand: Total assist;+2 physical assistance   Squat pivot transfers: Total assist;+2 physical assistance     General transfer comment: Bed pad used to clear hips from bed and squat pivot pt to chair. Appeared very anxious and fearful of falling during transfer.   Ambulation/Gait             General Gait Details: Unable at this time.   Stairs            Wheelchair Mobility    Modified Rankin (Stroke Patients Only)       Balance Overall balance assessment: History of Falls                                           Pertinent Vitals/Pain Pain Assessment: Faces Faces Pain Scale: Hurts even more Pain Location: LLE during mobility Pain Descriptors / Indicators: Grimacing;Guarding Pain Intervention(s): Limited activity within patient's tolerance;Monitored during session;Repositioned    Home Living Family/patient expects to be discharged to:: Skilled nursing facility Living Arrangements: Children               Additional Comments: Pt's daughter reports that pt will have to ascend steps to enter home, and then will be on one level once inside. Has RW, 2 BSC, and shower seat.     Prior Function Level of  Independence: Needs assistance   Gait / Transfers Assistance Needed: Uses RW almost all the time (may walk a few feet without it per daughter)  ADL's / Homemaking Assistance Needed: Per daughter, pt was mostly sponge bathing but would also get into the shower. Doing own bathing and dressing and putting on make-up.        Hand Dominance        Extremity/Trunk Assessment   Upper Extremity Assessment: LUE deficits/detail       LUE Deficits / Details: Daughter states that pt hit L shoulder during fall and was concerned  about injury. Pt was not following commands to assess AROM, however did not show any grimacing or report any pain during AROM. Feel she has age-appropriate range on the L.    Lower Extremity Assessment: Generalized weakness;LLE deficits/detail   LLE Deficits / Details: Decreased strength/ AROM, and acute pain consistent with above mentioned diagnosis.  Cervical / Trunk Assessment: Kyphotic  Communication   Communication: No difficulties  Cognition Arousal/Alertness: Awake/alert Behavior During Therapy: Flat affect Overall Cognitive Status: Impaired/Different from baseline Area of Impairment: Attention;Following commands;Safety/judgement;Awareness;Problem solving   Current Attention Level: Focused Memory: Decreased short-term memory Following Commands: Follows one step commands inconsistently;Follows one step commands with increased time Safety/Judgement: Decreased awareness of deficits Awareness: Intellectual Problem Solving: Slow processing;Decreased initiation;Difficulty sequencing General Comments: Pt does not say much during session. Answered simple questions with one word answers    General Comments      Exercises        Assessment/Plan    PT Assessment Patient needs continued PT services  PT Diagnosis Difficulty walking;Acute pain   PT Problem List Decreased strength;Decreased range of motion;Decreased activity tolerance;Decreased balance;Decreased mobility;Decreased knowledge of use of DME;Decreased safety awareness;Decreased knowledge of precautions;Cardiopulmonary status limiting activity;Pain  PT Treatment Interventions DME instruction;Gait training;Stair training;Functional mobility training;Therapeutic activities;Therapeutic exercise;Neuromuscular re-education;Patient/family education   PT Goals (Current goals can be found in the Care Plan section) Acute Rehab PT Goals Patient Stated Goal: Pt unable to participate in goal setting PT Goal Formulation: With  family Time For Goal Achievement: 07/12/15 Potential to Achieve Goals: Fair    Frequency Min 3X/week   Barriers to discharge        Co-evaluation               End of Session Equipment Utilized During Treatment: Gait belt;Oxygen Activity Tolerance: No increased pain Patient left: in chair;with call bell/phone within reach;with family/visitor present Nurse Communication: Mobility status         Time: 0833-0902 PT Time Calculation (min) (ACUTE ONLY): 29 min   Charges:   PT Evaluation $Initial PT Evaluation Tier I: 1 Procedure PT Treatments $Therapeutic Activity: 8-22 mins   PT G Codes:        Conni Slipper Jul 09, 2015, 11:18 AM   Conni Slipper, PT, DPT Acute Rehabilitation Services Pager: [phone]

## 2015-06-28 NOTE — Progress Notes (Signed)
Initial Nutrition Assessment  DOCUMENTATION CODES:  Not applicable  INTERVENTION:  Magic cup BID with meals, each supplement provides 290 kcal and 9 grams of protein  Kozy Shack pudding BID, each provides 125 kcals and 3 grams Pro.  NUTRITION DIAGNOSIS:  Inadequate oral intake related to poor appetite as evidenced by loss of ~6% bw in 4 months  GOAL:  Patient will meet greater than or equal to 90% of their needs  MONITOR:  Diet advancement, Supplement acceptance, PO intake, Labs, Skin, I & O's  REASON FOR ASSESSMENT:  Malnutrition Screening Tool    ASSESSMENT:  81 y.o. female  PMH-CAD, HTN, HLD, GERD, Parkinson's disease, mild dementia and chronic kidney disease presented to the  following a fall at home leading to left hip fracture now s/p ORIF left hip  Spoke with daughter d/t patient dementia. She reports that she has noticed a progressive decline in the patients oral intake over the past year. At home, the patient eats a mechanical soft diet. Daughter notes that pt does have some trouble swallowing and can be seen choking on foods occasionally. She is only able to tolerate certain solids.  Daughter states pt has some issues with constipation/diarrhea but these are not new.    Daughter gives pt a ensure smoothie daily that has peanut butter and ice cream in it. Daughter states that pt's nephrologist told her that she didn't need any vit/minerals so the pt is not receiving any at this time.   Daughter states patients new normal weight is 98 lbs. She used to be 105 at the beginning of the year. Amount of wt loss is not clinically significant for time frame.   Diet Order:  Diet regular Room service appropriate?: Yes; Fluid consistency:: Thin   Skin:  Dry, w/ abrasions, ecchymosis, and Surgical Incisions  Last BM:  8/11   Height:  Ht Readings from Last 1 Encounters:  06/09/15 5' (1.524 m)   Weight:  Wt Readings from Last 1 Encounters:  06/09/15 99 lb 7 oz (45.105 kg)    Wt Readings from Last 10 Encounters:  06/09/15 99 lb 7 oz (45.105 kg)  04/24/15 100 lb (45.36 kg)  04/09/15 102 lb 1 oz (46.295 kg)  03/06/15 105 lb (47.628 kg)  03/06/15 105 lb (47.628 kg)  03/06/15 105 lb (47.628 kg)  03/03/15 105 lb 2 oz (47.684 kg)  02/27/15 103 lb (46.72 kg)  01/30/15 106 lb (48.081 kg)  12/19/14 108 lb (48.988 kg)   Ideal Body Weight:  45.45 kg  BMI:  19.4  Estimated Nutritional Needs:  Kcal:  1300-1500- 29-33 kcal/kg Protein:  45-55 g (1-1.2 g/kg IBW) Fluid:  1..4 liters  EDUCATION NEEDS:  No education needs identified at this time  Christophe Louis RD, LDN Nutrition Pager: [phone] 06/28/2015 12:55 PM

## 2015-06-28 NOTE — Progress Notes (Signed)
OT Cancellation Note  Patient Details Name: Lisa Evans MRN: [id number] DOB: October 25, 1923   Cancelled Treatment:    Reason Eval/Treat Not Completed: Other (comment). Pt is Medicare and current D/C plan is SNF. No apparent immediate acute care OT needs, therefore will defer OT to SNF. If OT eval is needed please call Acute Rehab Dept. at [phone] or text page OT at [phone].     Evette Georges [phone] 06/28/2015, 4:15 PM

## 2015-06-28 NOTE — Care Management Note (Addendum)
Case Management Note  Patient Details  Name: Lisa Evans MRN: [id number] Date of Birth: 1923-02-23  Subjective/Objective:   81 yo  F sustained a L comminuted intertrochanteric hip fracture/proximal femur fracture after falling at home. She underwent open reduction and internal fixation of L hip fx.           Action/Plan: received referral to assist with HH needs, DME, and meds   Expected Discharge Date:   06/30/15               Expected Discharge Plan:  [place]  In-House Referral:  Clinical Social Work  Discharge planning Services  CM Consult  Post Acute Care Choice:    Choice offered to:     DME Arranged:    DME Agency:     HH Arranged:    [place] Agency:     Status of Service:  In process, will continue to follow  Medicare Important Message Given:   yes Date Medicare IM Given:   06/28/15 Medicare IM give by:   Frann Rider, RN, BSN Date Additional Medicare IM Given:    Additional Medicare Important Message give by:     If discussed at [place] of Stay Meetings, dates discussed:    Additional Comments: met with pt and daughter Lisa Evans). Pt is asleep. Pt lives with her daughter. D/C plan is SNF. Discussed SW referral to assist with SNF placement.  Norina Buzzard, RN 06/28/2015, 10:49 AM

## 2015-06-28 NOTE — Progress Notes (Signed)
UR Completed. Odaliz Mcqueary, RN, BSN.  [phone] 

## 2015-06-29 DIAGNOSIS — F0390 Unspecified dementia without behavioral disturbance: Secondary | ICD-10-CM

## 2015-06-29 DIAGNOSIS — D62 Acute posthemorrhagic anemia: Secondary | ICD-10-CM

## 2015-06-29 DIAGNOSIS — N390 Urinary tract infection, site not specified: Secondary | ICD-10-CM

## 2015-06-29 DIAGNOSIS — S72001D Fracture of unspecified part of neck of right femur, subsequent encounter for closed fracture with routine healing: Secondary | ICD-10-CM

## 2015-06-29 LAB — COMPREHENSIVE METABOLIC PANEL
ALK PHOS: 49 U/L (ref 38–126)
ALT: 8 U/L — ABNORMAL LOW (ref 14–54)
ANION GAP: 8 (ref 5–15)
AST: 25 U/L (ref 15–41)
Albumin: 2.5 g/dL — ABNORMAL LOW (ref 3.5–5.0)
BILIRUBIN TOTAL: 0.7 mg/dL (ref 0.3–1.2)
BUN: 34 mg/dL — ABNORMAL HIGH (ref 6–20)
CHLORIDE: 103 mmol/L (ref 101–111)
CO2: 22 mmol/L (ref 22–32)
Calcium: 8.1 mg/dL — ABNORMAL LOW (ref 8.9–10.3)
Creatinine, Ser: 2.11 mg/dL — ABNORMAL HIGH (ref 0.44–1.00)
GFR calc Af Amer: 23 mL/min — ABNORMAL LOW (ref >60)
GFR, EST NON AFRICAN AMERICAN: 20 mL/min — AB (ref >60)
GLUCOSE: 152 mg/dL — AB (ref 65–99)
Potassium: 4.8 mmol/L (ref 3.5–5.1)
SODIUM: 133 mmol/L — AB (ref 135–145)
Total Protein: 4.8 g/dL — ABNORMAL LOW (ref 6.5–8.1)

## 2015-06-29 LAB — CBC
HEMATOCRIT: 27.3 % — AB (ref 36.0–46.0)
HEMOGLOBIN: 9.2 g/dL — AB (ref 12.0–15.0)
MCH: 29.5 pg (ref 26.0–34.0)
MCHC: 33.7 g/dL (ref 30.0–36.0)
MCV: 87.5 fL (ref 78.0–100.0)
Platelets: 108 10*3/uL — ABNORMAL LOW (ref 150–400)
RBC: 3.12 MIL/uL — AB (ref 3.87–5.11)
RDW: 16.3 % — AB (ref 11.5–15.5)
WBC: 7.8 10*3/uL (ref 4.0–10.5)

## 2015-06-29 LAB — PREPARE RBC (CROSSMATCH)

## 2015-06-29 MED ORDER — ACETAMINOPHEN 325 MG PO TABS
650.0000 mg | ORAL_TABLET | Freq: Three times a day (TID) | ORAL | Status: DC
  Administered 2015-06-29 – 2015-07-01 (×6): 650 mg via ORAL
  Filled 2015-06-29 (×6): qty 2

## 2015-06-29 MED ORDER — POLYSACCHARIDE IRON COMPLEX 150 MG PO CAPS
150.0000 mg | ORAL_CAPSULE | Freq: Every day | ORAL | Status: DC
  Filled 2015-06-29 (×2): qty 1

## 2015-06-29 MED ORDER — RESOURCE THICKENUP CLEAR PO POWD
ORAL | Status: DC | PRN
  Filled 2015-06-29: qty 125

## 2015-06-29 MED ORDER — CIPROFLOXACIN IN D5W 400 MG/200ML IV SOLN
400.0000 mg | INTRAVENOUS | Status: DC
  Administered 2015-06-29 – 2015-06-30 (×2): 400 mg via INTRAVENOUS
  Filled 2015-06-29 (×2): qty 200

## 2015-06-29 MED ORDER — ACETAMINOPHEN 325 MG PO TABS
650.0000 mg | ORAL_TABLET | Freq: Once | ORAL | Status: AC
Start: 2015-06-29 — End: 2015-06-29
  Administered 2015-06-29: 650 mg via ORAL
  Filled 2015-06-29: qty 2

## 2015-06-29 MED ORDER — SODIUM CHLORIDE 0.9 % IV SOLN: Freq: Once | INTRAVENOUS | Status: DC

## 2015-06-29 NOTE — Progress Notes (Signed)
[facility] PROGRESS NOTE  Lisa Evans ZOX:[id number] DOB: Nov 22, 1922 DOA: 06/27/2015 PCP: Nicki Reaper, NP  Assessment/Plan: 1-Closed left hip fracture: s/p internal fixation on 8/12 -will follow ortho rec's as part of post operative course -PT/OT to assess Capacity to performed ADL and need of assistance  -will most likely need SNF  2-HTN: will continue current antihypertensive agents.  -lisinopril on hold due to renal failure -BP is soft/low -will moitor -continue IVF's  3-acute on chronic renal failure: due to hypotension and decrease perfusion; also continued use of lisinopril and UTI. Stage 3-4 at baseline -will stop/avoid nephrotoxic agents -will d/c lisinopril -avoid hypotension -continue tx for UTI -Cr 2.11 -follow renal function  4-acute on chronic anemia due to blood loss: -will transfuse 2 units -start niferex -follow Hgb trend  5-Parkinson's disease: will continue home medications  6-difficulty swallowing: associated with parkinson's most likely -will ask SLP has evaluated patient and recommended dysphagia 1 honey thick liquids.  7-CAD status post CABG: No cardiac symptoms with limited activity at home. Continue aspirin, beta blockers and statins. Echo 07/20/14: LVEF 50% and akinesis of basal/mid inferolateral myocardium  8-UTI: will follow urine cultures -empirically initiated on ciprofloxacin   Code: Full Family Communication: daughter at bedside Disposition Plan: will most likely SNF at discharge. PT/O ask to see patient for assessmn   Consultants:  Orthopedic service (Dr. Magnus Ivan)  Procedures:  internal fixation for intertrochanteric hip fracture.  Antibiotics: cipro 8/14  HPI/Subjective: Afebrile, no CP. Patient with low Hgb and UA suggesting UTI.  Objective: Filed Vitals:   06/29/15 1300  BP:   Pulse:   Temp: 98 F (36.7 C)  Resp:     Intake/Output Summary (Last 24 hours) at 06/29/15 1607 Last data filed at 06/29/15 8119   Gross per 24 hour  Intake 729.58 ml  Output    310 ml  Net 419.58 ml    Exam:   General:  Afbrile, more alert, oriented to person. Denies CP.  Cardiovascular: S1 and S2, no rubs, no gallops, positive murmur   Respiratory: CTA bilaterally  Abdomen: soft, ND. Positive BS  Musculoskeletal: no cyanosis or clubbing.  Data Reviewed: Basic Metabolic Panel:  Recent Labs Lab 06/27/15 1039 06/28/15 0533 06/29/15 0117  NA 137 133* 133*  K 3.9 4.9 4.8  CL 99* 100* 103  CO2 GLUCOSE 135* 241* 152*  BUN 16 24* 34*  CREATININE 1.06* 1.94* 2.11*  CALCIUM 9.9 8.2* 8.1*   CBC:  Recent Labs Lab 06/27/15 1039 06/28/15 2233  WBC 6.3 10.0  NEUTROABS 4.7  --   HGB 11.6* 7.0*  HCT 35.4* 21.0*  MCV 93.2 90.5  PLT 194 150    Recent Results (from the past 240 hour(s))  Surgical pcr screen     Status: Abnormal   Collection Time: 06/27/15  2:33 PM  Result Value Ref Range Status   MRSA, PCR NEGATIVE NEGATIVE Final   Staphylococcus aureus POSITIVE (A) NEGATIVE Final    Comment:        The Xpert SA Assay (FDA approved for NASAL specimens in patients over 61 years of age), is one component of a comprehensive surveillance program.  Test performance has been validated by [facility] for patients greater than or equal to 40 year old. It is not intended to diagnose infection nor to guide or monitor treatment.      Studies: Dg C-arm 1-60 Min  06/27/2015   CLINICAL DATA:  Left intertrochanteric fracture  EXAM: DG C-ARM 61-120 MIN;  LEFT FEMUR 2 VIEWS  COMPARISON:  None  FLUOROSCOPY TIME:  1 minutes 47 seconds  Four images  FINDINGS: ORIF left intertrochanteric fracture transfixed with an intra medullary nail and 2 interlocking cannulated femoral neck screws. Near anatomic alignment. Lesser trochanteric fragment is medially displaced. No hip dislocation.  IMPRESSION: Interval ORIF left intertrochanteric fracture.   Electronically Signed   By: Elige Ko   On: 06/27/2015  17:31   Dg Femur Min 2 Views Left  06/27/2015   CLINICAL DATA:  Left intertrochanteric fracture  EXAM: DG C-ARM 61-120 MIN; LEFT FEMUR 2 VIEWS  COMPARISON:  None  FLUOROSCOPY TIME:  1 minutes 47 seconds  Four images  FINDINGS: ORIF left intertrochanteric fracture transfixed with an intra medullary nail and 2 interlocking cannulated femoral neck screws. Near anatomic alignment. Lesser trochanteric fragment is medially displaced. No hip dislocation.  IMPRESSION: Interval ORIF left intertrochanteric fracture.   Electronically Signed   By: Elige Ko   On: 06/27/2015 17:31    Scheduled Meds: . sodium chloride   Intravenous Once  . acetaminophen  650 mg Oral TID  . aspirin EC  325 mg Oral Q breakfast  . atorvastatin  20 mg Oral Daily  . carbidopa-levodopa  0.5-1 tablet Oral Daily  . Chlorhexidine Gluconate Cloth  6 each Topical Daily  . ciprofloxacin  400 mg Intravenous Q24H  . meclizine  25 mg Oral Daily  . metoprolol succinate  25 mg Oral Daily  . mupirocin ointment  1 application Nasal BID   Continuous Infusions: . sodium chloride Stopped (06/29/15 0141)    Principal Problem:   Hip fracture Active Problems:   HTN (hypertension)   HLD (hyperlipidemia)   CAD (coronary artery disease)   Parkinson's disease   Dementia   Closed left hip fracture    Time spent: 30 minutes    Vassie Loll  [facility] Pager [phone]. If 7PM-7AM, please contact night-coverage at [email or web address], password [facility] 06/29/2015, 4:07 PM  LOS: 2 days

## 2015-06-29 NOTE — Progress Notes (Signed)
Patient's daughter expressed concern about the following: 1) urine output, 2) heart rate, 3) swallowing and 4) overall affect.  Patient's daughter states her mother is not talking as much and is slower to respond.  On call, Lance Coon, PA contacted.  Orders to increase rate of NS to 75 ml/hr.  Patient placed on cardiac monitoring.  Patient's daughter informed that a swallow evaluation has been ordered.  Everett Graff, NP to also evaluate patient.  Patient provided with pain medication.  VSS, will continue to monitor.

## 2015-06-29 NOTE — Progress Notes (Signed)
ANTIBIOTIC CONSULT NOTE - INITIAL  Pharmacy Consult for Ciprofloxacin  Indication: UTI   Allergies  Allergen Reactions  . Penicillins Hives    Patient Measurements: Weight: 99 lb 6.8 oz (45.1 kg)  Vital Signs: Temp: 98.4 F (36.9 C) (08/14 0009) Temp Source: Axillary (08/14 0009) BP: 103/43 mmHg (08/14 0009) Pulse Rate: 81 (08/14 0009) Intake/Output from previous day: 08/13 0701 - 08/14 0700 In: 15 [P.O.:15] Out: 200 [Urine:200] Intake/Output from this shift: Total I/O In: -  Out: 200 [Urine:200]  Labs:  Recent Labs  06/27/15 1039 06/28/15 0533 06/28/15 2233  WBC 6.3  --  10.0  HGB 11.6*  --  7.0*  PLT 194  --  150  CREATININE 1.06* 1.94*  --    Estimated Creatinine Clearance: 13.7 mL/min (by C-G formula based on Cr of 1.94). No results for input(s): VANCOTROUGH, VANCOPEAK, VANCORANDOM, GENTTROUGH, GENTPEAK, GENTRANDOM, TOBRATROUGH, TOBRAPEAK, TOBRARND, AMIKACINPEAK, AMIKACINTROU, AMIKACIN in the last 72 hours.   Microbiology: Recent Results (from the past 720 hour(s))  Surgical pcr screen     Status: Abnormal   Collection Time: 06/27/15  2:33 PM  Result Value Ref Range Status   MRSA, PCR NEGATIVE NEGATIVE Final   Staphylococcus aureus POSITIVE (A) NEGATIVE Final    Comment:        The Xpert SA Assay (FDA approved for NASAL specimens in patients over 79 years of age), is one component of a comprehensive surveillance program.  Test performance has been validated by [facility] for patients greater than or equal to 79 year old. It is not intended to diagnose infection nor to guide or monitor treatment.     Medical History: Past Medical History  Diagnosis Date  . Chicken pox   . Depression   . GERD (gastroesophageal reflux disease)   . History of stomach ulcers   . Hyperlipidemia   . Hypertension   . Urine incontinence   . Parkinson disease     "early; effects her gait; doesn't have tremors" (06/27/2015)  . PONV (postoperative nausea and  vomiting)   . Family history of adverse reaction to anesthesia     "her niece gets sick w/anesthesia too"  . Hypercholesterolemia   . CHF (congestive heart failure)   . Myocardial infarction 1999  . Iron deficiency anemia     "has had iron infusions a couple times" (06/27/2015)  . Daily headache     "they are constant" (06/27/2015)  . Arthritis     "hands, probably in her back" (06/27/2015)  . Chronic kidney disease (CKD), stage III (moderate)   . Mitral valve prolapse     Medications:  Prescriptions prior to admission  Medication Sig Dispense Refill Last Dose  . acetaminophen (TYLENOL) 500 MG tablet Take 500 mg by mouth every 6 (six) hours as needed.   06/26/2015 at Unknown time  . aspirin 325 MG tablet Take 325 mg by mouth daily.   06/26/2015 at Unknown time  . atorvastatin (LIPITOR) 40 MG tablet Take 1 tablet (40 mg total) by mouth daily. (Patient taking differently: Take 20 mg by mouth daily. ) 30 tablet 3 06/26/2015 at Unknown time  . carbidopa-levodopa (SINEMET IR) 25-100 MG per tablet Take 1 tablet by mouth 3 (three) times daily. (Patient taking differently: Take 0.5-1 tablets by mouth daily. 1/2 in the morning 1 in the evening) 90 tablet 5 06/26/2015 at Unknown time  . lisinopril (PRINIVIL,ZESTRIL) 20 MG tablet Take 0.5 tablets (10 mg total) by mouth daily. 30 tablet 5 06/26/2015 at Unknown  time  . meclizine (ANTIVERT) 25 MG tablet Take 25 mg by mouth daily.    06/26/2015 at Unknown time  . metoprolol succinate (TOPROL-XL) 25 MG 24 hr tablet TAKE 1 TABLET BY MOUTH DAILY 30 tablet 5 06/26/2015 at 1800   Assessment: 81 YOF who presented with closed left hip fracture to start IV ciprofloxacin for UTI. WBC wnl. SCr up to 1.94 (BL ~ 1).   Goal of Therapy:  Resolution of infection   Plan:  -Start Ciprofloxacin 400 mg IV Q 24 hours -F/u cultures, renal fx, and clinical progress   Vinnie Level, PharmD., BCPS Clinical Pharmacist Pager [phone]

## 2015-06-29 NOTE — Progress Notes (Signed)
Zelda from Hematology called at 2130 on 06/29/15 to report a Hemoglobin of 7.1, a redraw was ordered to confirm this lab result.  The repeat lab result for the Hemoglobin was 7.0 at 2233.  On call, Lance Coon, PA contacted.  New orders to transfuse 2 PRBCs received.  Will continue to monitor patient.

## 2015-06-29 NOTE — Evaluation (Signed)
Clinical/Bedside Swallow Evaluation Patient Details  Name: Lisa Evans MRN: [id number] Date of Birth: 1923-01-26  Today's Date: 06/29/2015 Time: SLP Start Time (ACUTE ONLY): 1025 SLP Stop Time (ACUTE ONLY): 1101 SLP Time Calculation (min) (ACUTE ONLY): 36 min  Past Medical History:  Past Medical History  Diagnosis Date  . Chicken pox   . Depression   . GERD (gastroesophageal reflux disease)   . History of stomach ulcers   . Hyperlipidemia   . Hypertension   . Urine incontinence   . Parkinson disease     "early; effects her gait; doesn't have tremors" (06/27/2015)  . PONV (postoperative nausea and vomiting)   . Family history of adverse reaction to anesthesia     "her niece gets sick w/anesthesia too"  . Hypercholesterolemia   . CHF (congestive heart failure)   . Myocardial infarction 1999  . Iron deficiency anemia     "has had iron infusions a couple times" (06/27/2015)  . Daily headache     "they are constant" (06/27/2015)  . Arthritis     "hands, probably in her back" (06/27/2015)  . Chronic kidney disease (CKD), stage III (moderate)   . Mitral valve prolapse    Past Surgical History:  Past Surgical History  Procedure Laterality Date  . Coronary artery bypass graft      CABG X4  . Appendectomy    . Intertrochanteric hip fracture surgery Left 06/27/2015  . Cataract extraction, bilateral Bilateral   . Fracture surgery     HPI:  Pt is a 81 y/o female who lives with family and at baseline ambulates with RW. She has an extensive PMH of CAD s/p CABG in 1999, HTN, HLD, Parkinson's, dementia, CKD. Pt [facility] to the [facility] on 8/12 following a fall at home leading to a L displaced, communited proximal femur intertrochanteric fracture with angulation. S/P ORIF and IM nailing on 8/12. MD placed on dysphaiga 2 diet with nectar thick liquids while awaiting swallow evaluation due to observed coughing with thin liquids.    Assessment / Plan / Recommendation Clinical Impression  Patient  presents with a suspected mild baseline dysphagia (history of Parkinson's, family report of coughing with solid pos), now exacerbated by acute changes in strength and mentation. Inconsistent throat clearing noted both with and without po intake, increasing with thin and nectar thick liquid trials. Trials of pureed solids and honey thick liquids elicited more timely oral transit of bolus, decreased anterior labial spillage, and what appeared to be more timely swallow response. Discussed findings with patient's daughter. Recommend diet downgrade to decrease aspiration risk with close SLP f/u to determine potential to advance diet as mentation returns to baseline.     Aspiration Risk  Moderate    Diet Recommendation Dysphagia 1 (Puree);Honey   Medication Administration: Crushed with puree Compensations: Slow rate;Small sips/bites    Other  Recommendations Oral Care Recommendations: Oral care BID Other Recommendations: Order thickener from pharmacy;Prohibited food (jello, ice cream, thin soups);Remove water pitcher   Follow Up Recommendations       Frequency and Duration min 3x week  2 weeks   Pertinent Vitals/Pain n/a        Swallow Study    General Other Pertinent Information: Pt is a 81 y/o female who lives with family and at baseline ambulates with RW. She has an extensive PMH of CAD s/p CABG in 1999, HTN, HLD, Parkinson's, dementia, CKD. Pt [facility] the [facility] 8/12 following a fall at home leading to a L displaced, communited  proximal femur intertrochanteric fracture with angulation. S/P ORIF and IM nailing on 8/12. MD placed on dysphaiga 2 diet with nectar thick liquids while awaiting swallow evaluation due to observed coughing with thin liquids.  Type of Study: Bedside swallow evaluation Previous Swallow Assessment: none noted Diet Prior to this Study: Dysphagia 2 (chopped);Nectar-thick liquids Temperature Spikes Noted: No Respiratory Status: Supplemental O2 delivered via  (comment) (nasal cannula) History of Recent Intubation: Yes (for surgery only) Behavior/Cognition: Lethargic/Drowsy;Pleasant mood;Requires cueing;Distractible Oral Cavity - Dentition: Adequate natural dentition/normal for age Self-Feeding Abilities: Able to feed self;Needs assist Patient Positioning: Upright in bed Baseline Vocal Quality: Low vocal intensity Volitional Cough: Weak Volitional Swallow: Able to elicit    Oral/Motor/Sensory Function Overall Oral Motor/Sensory Function: Appears within functional limits for tasks assessed   Ice Chips Ice chips: Impaired Presentation: Spoon Pharyngeal Phase Impairments: Throat Clearing - Delayed   Thin Liquid Thin Liquid: Impaired Presentation: Cup;Straw Oral Phase Impairments: Reduced labial seal Oral Phase Functional Implications: Right anterior spillage Pharyngeal  Phase Impairments: Throat Clearing - Delayed;Throat Clearing - Immediate (audible swallow)    Nectar Thick Nectar Thick Liquid: Impaired Presentation: Cup Pharyngeal Phase Impairments: Throat Clearing - Delayed;Throat Clearing - Immediate (audible swallow)   Honey Thick Honey Thick Liquid: Impaired Presentation: Spoon Pharyngeal Phase Impairments: Throat Clearing - Delayed (post po intake)   Puree Puree: Impaired Presentation: Spoon Pharyngeal Phase Impairments: Throat Clearing - Delayed   Solid   GO   Laurene Melendrez MA, CCC-SLP [phone]  Solid: Impaired Oral Phase Impairments: Impaired anterior to posterior transit Oral Phase Functional Implications: Oral holding;Oral residue Pharyngeal Phase Impairments: Throat Clearing - Delayed       Milagro Belmares Meryl 06/29/2015,11:08 AM

## 2015-06-30 ENCOUNTER — Telehealth: Payer: Self-pay

## 2015-06-30 ENCOUNTER — Encounter (HOSPITAL_COMMUNITY): Payer: Self-pay | Admitting: Orthopaedic Surgery

## 2015-06-30 LAB — CBC
HEMATOCRIT: 28.9 % — AB (ref 36.0–46.0)
HEMOGLOBIN: 9.8 g/dL — AB (ref 12.0–15.0)
MCH: 30 pg (ref 26.0–34.0)
MCHC: 33.9 g/dL (ref 30.0–36.0)
MCV: 88.4 fL (ref 78.0–100.0)
Platelets: 100 10*3/uL — ABNORMAL LOW (ref 150–400)
RBC: 3.27 MIL/uL — ABNORMAL LOW (ref 3.87–5.11)
RDW: 16.4 % — AB (ref 11.5–15.5)
WBC: 7.8 10*3/uL (ref 4.0–10.5)

## 2015-06-30 LAB — TYPE AND SCREEN
ABO/RH(D): B POS
Antibody Screen: NEGATIVE
Unit division: 0
Unit division: 0

## 2015-06-30 LAB — BASIC METABOLIC PANEL
Anion gap: 6 (ref 5–15)
BUN: 26 mg/dL — ABNORMAL HIGH (ref 6–20)
CALCIUM: 8.2 mg/dL — AB (ref 8.9–10.3)
CHLORIDE: 108 mmol/L (ref 101–111)
CO2: 22 mmol/L (ref 22–32)
CREATININE: 1.16 mg/dL — AB (ref 0.44–1.00)
GFR calc Af Amer: 47 mL/min — ABNORMAL LOW (ref >60)
GFR calc non Af Amer: 40 mL/min — ABNORMAL LOW (ref >60)
GLUCOSE: 94 mg/dL (ref 65–99)
Potassium: 4.5 mmol/L (ref 3.5–5.1)
Sodium: 136 mmol/L (ref 135–145)

## 2015-06-30 MED ORDER — FERROUS SULFATE 325 (65 FE) MG PO TABS
325.0000 mg | ORAL_TABLET | Freq: Two times a day (BID) | ORAL | Status: DC
  Administered 2015-06-30 – 2015-07-01 (×2): 325 mg via ORAL
  Filled 2015-06-30 (×2): qty 1

## 2015-06-30 MED ORDER — POLYETHYLENE GLYCOL 3350 17 G PO PACK
17.0000 g | PACK | Freq: Every day | ORAL | Status: DC
  Administered 2015-06-30 – 2015-07-01 (×2): 17 g via ORAL
  Filled 2015-06-30 (×2): qty 1

## 2015-06-30 MED ORDER — SENNOSIDES-DOCUSATE SODIUM 8.6-50 MG PO TABS
1.0000 | ORAL_TABLET | Freq: Two times a day (BID) | ORAL | Status: DC
  Administered 2015-06-30 – 2015-07-01 (×3): 1 via ORAL
  Filled 2015-06-30 (×3): qty 1

## 2015-06-30 MED ORDER — CIPROFLOXACIN HCL 500 MG PO TABS
500.0000 mg | ORAL_TABLET | Freq: Every day | ORAL | Status: DC
  Administered 2015-07-01: 500 mg via ORAL
  Filled 2015-06-30 (×2): qty 1

## 2015-06-30 NOTE — Progress Notes (Signed)
[facility] PROGRESS NOTE  Lisa Evans ZOX:[id number] DOB: [birth year] DOA: 06/27/2015 PCP: Nicki Reaper, NP  Assessment/Plan: 1-Closed left hip fracture: s/p internal fixation on 8/12 -will follow ortho rec's as part of post operative course -PT/OT to assess Capacity to performed ADL and need of assistance  -will need SNF; SW on board and helping with placement   2-HTN: will continue current antihypertensive agents.  -lisinopril on hold due to renal failure -BP is much better -will moitor -continue IVF's but will adjust rate -If kidney function remains at baseline and blood pressure stable/rising will resume home antihypertensive medication  3-acute on chronic renal failure: due to hypotension and decrease perfusion; also continued use of lisinopril and UTI. Stage 3-4 at baseline -will continue avoiding/limiting the use of potential nephrotoxic agents -will continue holding lisinopril -avoid hypotension -continue tx for UTI -Cr has now improved and back to her baseline 1.16 -follow renal function trend  4-acute on chronic anemia due to blood loss: -Status post transfusion of 2 units of PRBCs -Hemoglobin up to 9.8 -Will follow Hgb trend -Continue ferrous sulfate  5-Parkinson's disease: will continue home medications  6-difficulty swallowing: associated with parkinson's most likely -will ask SLP has evaluated patient and recommended dysphagia 1 honey thick liquids.  7-CAD status post CABG: No cardiac symptoms with limited activity at home. Continue aspirin, beta blockers and statins. Echo 07/20/14: LVEF 50% and akinesis of basal/mid inferolateral myocardium  8-UTI: will follow urine cultures -empirically initiated on ciprofloxacin  -No fever -Will attempt removal of Foley catheter  Code: Full Family Communication: daughter at bedside Disposition Plan: will most likely SNF at discharge. PT/O ask to see patient for assessmn   Consultants:  Orthopedic service (Dr.  Magnus Ivan)  Procedures:  internal fixation for intertrochanteric hip fracture.  Antibiotics: cipro 8/14  HPI/Subjective: Afebrile, no CP. Patient alert, awake and oriented 1; appears more comfortable family from pain standpoint. No fever and with improved urine output  Objective: Filed Vitals:   06/30/15 1239  BP: 147/56  Pulse: 71  Temp: 98 F (36.7 C)  Resp: 16    Intake/Output Summary (Last 24 hours) at 06/30/15 1818 Last data filed at 06/30/15 1700  Gross per 24 hour  Intake 766.25 ml  Output   1100 ml  Net -333.75 ml    Exam:   General:  Afbrile, alert, awake and oriented 1. Denies CP; was able to participate better with physical therapy according to family member appears more comfortable regarding left hip pain.  Cardiovascular: S1 and S2, no rubs, no gallops, positive murmur   Respiratory: CTA bilaterally  Abdomen: soft, ND. Positive BS  Musculoskeletal: no cyanosis or clubbing.  Data Reviewed: Basic Metabolic Panel:  Recent Labs Lab 06/27/15 1039 06/28/15 0533 06/29/15 0117 06/30/15 0455  NA 137 133* 133* 136  K 3.9 4.9 4.8 4.5  CL 99* 100* 103 108  CO2 28 23 22 22   GLUCOSE 135* 241* 152* 94  BUN 16 24* 34* 26*  CREATININE 1.06* 1.94* 2.11* 1.16*  CALCIUM 9.9 8.2* 8.1* 8.2*   CBC:  Recent Labs Lab 06/27/15 1039 06/28/15 2233 06/29/15 2028 06/30/15 0455  WBC 6.3 10.0 7.8 7.8  NEUTROABS 4.7  --   --   --   HGB 11.6* 7.0* 9.2* 9.8*  HCT 35.4* 21.0* 27.3* 28.9*  MCV 93.2 90.5 87.5 88.4  PLT 194 150 108* 100*    Recent Results (from the past 240 hour(s))  Surgical pcr screen     Status: Abnormal   Collection  Time: 06/27/15  2:33 PM  Result Value Ref Range Status   MRSA, PCR NEGATIVE NEGATIVE Final   Staphylococcus aureus POSITIVE (A) NEGATIVE Final    Comment:        The Xpert SA Assay (FDA approved for NASAL specimens in patients over 12 years of age), is one component of a comprehensive surveillance program.  Test  performance has been validated by [facility] for patients greater than or equal to 5 year old. It is not intended to diagnose infection nor to guide or monitor treatment.   Culture, Urine     Status: None (Preliminary result)   Collection Time: 06/29/15  2:23 AM  Result Value Ref Range Status   Specimen Description URINE, CATHETERIZED  Final   Special Requests NONE  Final   Culture 10,000 COLONIES/mL ESCHERICHIA COLI  Final   Report Status PENDING  Incomplete     Studies: No results found.  Scheduled Meds: . sodium chloride   Intravenous Once  . acetaminophen  650 mg Oral TID  . aspirin EC  325 mg Oral Q breakfast  . atorvastatin  20 mg Oral Daily  . carbidopa-levodopa  0.5-1 tablet Oral Daily  . Chlorhexidine Gluconate Cloth  6 each Topical Daily  . [START ON 07/01/2015] ciprofloxacin  500 mg Oral Q breakfast  . ferrous sulfate  325 mg Oral BID WC  . meclizine  25 mg Oral Daily  . metoprolol succinate  25 mg Oral Daily  . mupirocin ointment  1 application Nasal BID  . polyethylene glycol  17 g Oral Daily  . senna-docusate  1 tablet Oral BID   Continuous Infusions: . sodium chloride 50 mL/hr at 06/30/15 1426    Principal Problem:   Hip fracture Active Problems:   HTN (hypertension)   HLD (hyperlipidemia)   CAD (coronary artery disease)   Parkinson's disease   Dementia   Closed left hip fracture   Time spent: 30 minutes   Vassie Loll  [facility] Pager [phone]. If 7PM-7AM, please contact night-coverage at [email or web address], password [facility] 06/30/2015, 6:18 PM  LOS: 3 days

## 2015-06-30 NOTE — Progress Notes (Signed)
ANTIBIOTIC CONSULT NOTE - Follow Up  Pharmacy Consult for Ciprofloxacin  Indication: UTI   Allergies  Allergen Reactions  . Penicillins Hives    Patient Measurements: Weight: 99 lb 6.8 oz (45.1 kg)  Vital Signs: Temp: 98 F (36.7 C) (08/15 1239) Temp Source: Oral (08/15 1239) BP: 147/56 mmHg (08/15 1239) Pulse Rate: 71 (08/15 1239) Intake/Output from previous day: 08/14 0701 - 08/15 0700 In: 320 [P.O.:120; IV Piggyback:200] Out: 950 [Urine:950] Intake/Output from this shift: Total I/O In: 446.3 [P.O.:240; I.V.:206.3] Out: -   Labs:  Recent Labs  06/28/15 0533 06/28/15 2233 06/29/15 0117 06/29/15 2028 06/30/15 0455  WBC  --  10.0  --  7.8 7.8  HGB  --  7.0*  --  9.2* 9.8*  PLT  --  150  --  108* 100*  CREATININE 1.94*  --  2.11*  --  1.16*   Estimated Creatinine Clearance: 22.9 mL/min (by C-G formula based on Cr of 1.16). No results for input(s): VANCOTROUGH, VANCOPEAK, VANCORANDOM, GENTTROUGH, GENTPEAK, GENTRANDOM, TOBRATROUGH, TOBRAPEAK, TOBRARND, AMIKACINPEAK, AMIKACINTROU, AMIKACIN in the last 72 hours.   Microbiology: Recent Results (from the past 720 hour(s))  Surgical pcr screen     Status: Abnormal   Collection Time: 06/27/15  2:33 PM  Result Value Ref Range Status   MRSA, PCR NEGATIVE NEGATIVE Final   Staphylococcus aureus POSITIVE (A) NEGATIVE Final    Comment:        The Xpert SA Assay (FDA approved for NASAL specimens in patients over 79 years of age), is one component of a comprehensive surveillance program.  Test performance has been validated by [facility] for patients greater than or equal to 79 year old. It is not intended to diagnose infection nor to guide or monitor treatment.   Culture, Urine     Status: None (Preliminary result)   Collection Time: 06/29/15  2:23 AM  Result Value Ref Range Status   Specimen Description URINE, CATHETERIZED  Final   Special Requests NONE  Final   Culture 10,000 COLONIES/mL ESCHERICHIA COLI  Final    Report Status PENDING  Incomplete    Medical History: Past Medical History  Diagnosis Date  . Chicken pox   . Depression   . GERD (gastroesophageal reflux disease)   . History of stomach ulcers   . Hyperlipidemia   . Hypertension   . Urine incontinence   . Parkinson disease     "early; effects her gait; doesn't have tremors" (06/27/2015)  . PONV (postoperative nausea and vomiting)   . Family history of adverse reaction to anesthesia     "her niece gets sick w/anesthesia too"  . Hypercholesterolemia   . CHF (congestive heart failure)   . Myocardial infarction 1999  . Iron deficiency anemia     "has had iron infusions a couple times" (06/27/2015)  . Daily headache     "they are constant" (06/27/2015)  . Arthritis     "hands, probably in her back" (06/27/2015)  . Chronic kidney disease (CKD), stage III (moderate)   . Mitral valve prolapse     Medications:  Prescriptions prior to admission  Medication Sig Dispense Refill Last Dose  . acetaminophen (TYLENOL) 500 MG tablet Take 500 mg by mouth every 6 (six) hours as needed.   06/26/2015 at Unknown time  . aspirin 325 MG tablet Take 325 mg by mouth daily.   06/26/2015 at Unknown time  . atorvastatin (LIPITOR) 40 MG tablet Take 1 tablet (40 mg total) by mouth daily. (Patient  taking differently: Take 20 mg by mouth daily. ) 30 tablet 3 06/26/2015 at Unknown time  . carbidopa-levodopa (SINEMET IR) 25-100 MG per tablet Take 1 tablet by mouth 3 (three) times daily. (Patient taking differently: Take 0.5-1 tablets by mouth daily. 1/2 in the morning 1 in the evening) 90 tablet 5 06/26/2015 at Unknown time  . lisinopril (PRINIVIL,ZESTRIL) 20 MG tablet Take 0.5 tablets (10 mg total) by mouth daily. 30 tablet 5 06/26/2015 at Unknown time  . meclizine (ANTIVERT) 25 MG tablet Take 25 mg by mouth daily.    06/26/2015 at Unknown time  . metoprolol succinate (TOPROL-XL) 25 MG 24 hr tablet TAKE 1 TABLET BY MOUTH DAILY 30 tablet 5 06/26/2015 at 1800    Assessment: 81 YOF who presented with closed left hip fracture started on IV ciprofloxacin for UTI 8/14. WBC wnl. SCr improved.  Tolerating oral meds, crushed with applesauce.  Goal of Therapy:  Resolution of infection   Plan:  Change Ciprofloxacin 500 mg PO Q 24 hours F/u Ecoli sensitivies Rx will sign off  [employer], Pharm.D., BCPS Clinical Pharmacist Pager [phone] 06/30/2015 1:38 PM

## 2015-06-30 NOTE — Clinical Social Work Note (Signed)
Clinical Social Work Assessment  Patient Details  Name: Lisa Evans MRN: [id number] Date of Birth: Dec 26, 1922  Date of referral:  06/30/15               Reason for consult:                   Permission sought to share information with:  Facility Medical sales representative, Family Supports Permission granted to share information::  No  Name::        Agency::     Relationship::     Contact Information:     Housing/Transportation Living arrangements for the past 2 months:    Source of Information:  Adult Children Patient Interpreter Needed:  None Criminal Activity/Legal Involvement Pertinent to Current Situation/Hospitalization:  No - Comment as needed Significant Relationships:  Adult Children (daughter) Lives with:    Do you feel safe going back to the place where you live?    Need for family participation in patient care:     Care giving concerns:  Daughter states she cannot care for patient at home    Social Worker assessment / plan:  CSW assessed patient at bedside with daughter present.  Patient is oriented to person only.  Daughter states she has a few facilities in mind for placement: [facility], [facility], [facility].  Lisa Evans's mother-in-law was at [facility] approx 2 years ago.  Lisa Evans is not interested in [facility] at this time-dissatisfied with patient care.  Daughter lives near [name] and would like to have patient receive STR near family so family can visit and "keep eyes on" the patient.  Daughter states that patient is from home with daughter and daughter's husband.  It is daughter's plan for patient to receive STR and return to living at home with her.  Daughter understands that patient will need to be a little more independent with ADLs in order to do so. ,  Employment status:  Retired Health and safety inspector:    PT Recommendations:  Skilled Holiday representative, 24 Hour Supervision Information / Referral to community resources:  Skilled Nursing  Facility  Patient/Family's Response to care:  Patient and daughter are agreeable to SNF  Patient/Family's Understanding of and Emotional Response to Diagnosis, Current Treatment, and Prognosis:  Daughter is very realistic regarding patient's level of care needed for after discharge and STR.  Emotional Assessment Appearance:  Appears stated age Attitude/Demeanor/Rapport:   (appropriate) Affect (typically observed):  Accepting, Adaptable, Appropriate Orientation:  Oriented to Self, Oriented to Place, Oriented to  Time, Oriented to Situation Alcohol / Substance use:  Not Applicable Psych involvement (Current and /or in the community):  No (Comment)  Discharge Needs  Concerns to be addressed:  No discharge needs identified Readmission within the last 30 days:    Current discharge risk:  None Barriers to Discharge:  No Barriers Identified   Rondel Baton, LCSW 06/30/2015, 1:01 PM

## 2015-06-30 NOTE — Progress Notes (Signed)
Speech Language Pathology Treatment: Dysphagia  Patient Details Name: Lisa Evans MRN: [id number] DOB: [birth year] Today's Date: 06/30/2015 Time: 4098-1191 SLP Time Calculation (min) (ACUTE ONLY): 12 min  Assessment / Plan / Recommendation Clinical Impression  SLP provided skilled observation of dinner tray, with limited intake despite encouragement from therapist and family. She declined all liquids offered, but did consume several bites of purees without overt signs of difficulty. Swallow occurred swiftly and without evidence of aspiration. Would continue with current diet at this time until pt can be observed with more intake to determine if diet advancement is appropriate.   HPI Other Pertinent Information: Pt is a 81 y/o female who lives with family and at baseline ambulates with RW. She has an extensive PMH of CAD s/p CABG in 1999, HTN, HLD, Parkinson's, dementia, CKD. Pt [facility] to the [facility] on 8[facility]12 following a fall at home leading to a L displaced, communited proximal femur intertrochanteric fracture with angulation. S/P ORIF and IM nailing on 8/12. MD placed on dysphaiga 2 diet with nectar thick liquids while awaiting swallow evaluation due to observed coughing with thin liquids.    Pertinent Vitals Pain Assessment: Faces Faces Pain Scale: No hurt  SLP Plan  Continue with current plan of care    Recommendations Diet recommendations: Dysphagia 1 (puree);Honey-thick liquid Liquids provided via: Teaspoon Medication Administration: Crushed with puree Supervision: Patient able to self feed;Full supervision/cueing for compensatory strategies Compensations: Slow rate;Small sips/bites;Minimize environmental distractions Postural Changes and/or Swallow Maneuvers: Seated upright 90 degrees;Upright 30-60 min after meal       Oral Care Recommendations: Oral care BID Follow up Recommendations: Skilled Nursing facility Plan: Continue with current plan of care    Maxcine Ham, M.A.  CCC-SLP [phone]  Maxcine Ham 06/30/2015, 4:42 PM

## 2015-06-30 NOTE — Clinical Social Work Note (Signed)
Daughter will view STR/SNF facilities today/this afternoon and relay SNF choice to CSW tomorrow (8/16-Tuesday).  Vickii Penna, LCSW [phone]  Psychiatric & Orthopedics (5N 1-8) Clinical Social Worker

## 2015-06-30 NOTE — Progress Notes (Signed)
Attempted education of Flutter Valve.  Pt very confused.  Unable to perform.  Will attempt again later.

## 2015-06-30 NOTE — Progress Notes (Signed)
Physical Therapy Treatment Patient Details Name: Lisa Evans MRN: [id number] DOB: 06/03/1923 Today's Date: 06/30/2015    History of Present Illness Pt is a 81 y/o female who lives with family and at baseline ambulates with RW. She has an extensive PMH of CAD s/p CABG in 1999, HTN, HLD, Parkinson's, dementia, CKD. Pt presented to the [facility] on 8/12 following a fall at home leading to a L displaced, communited proximal femur intertrochanteric fracture with angulation. S/P ORIF and IM nailing on 8/12.     PT Comments    Patient slow with mobilization, requiring +2 max assistance. PT sessions working on bed mobility and transfers. Patient's daughter present and observing throughout session. Recommending SNF for further rehabilitation.   Follow Up Recommendations  SNF;Supervision/Assistance - 24 hour     Equipment Recommendations  Wheelchair (measurements PT);Wheelchair cushion (measurements PT)    Recommendations for Other Services       Precautions / Restrictions Precautions Precautions: Fall Precaution Comments: Hit L side during fall - L UE unjury as well per daughter, however pt did not report pain during session.  Restrictions Weight Bearing Restrictions: Yes LLE Weight Bearing: Weight bearing as tolerated    Mobility  Bed Mobility Overal bed mobility: Needs Assistance Bed Mobility: Supine to Sit     Supine to sit: +2 for physical assistance;Max assist     General bed mobility comments: Max assist +2 with cues for patient participation  Transfers Overall transfer level: Needs assistance Equipment used: Rolling walker (2 wheeled) Transfers: Sit to/from [employer] Sit to Stand: Max assist;+2 physical assistance Stand pivot transfers: Max assist;+2 physical assistance       General transfer comment: assistance needed for moving walker, transfer sequence and +2 physical assistance throughout. Up in chair after session. Patient's daughter present and  observing throughout session.   Ambulation/Gait                 Stairs            Wheelchair Mobility    Modified Rankin (Stroke Patients Only)       Balance Overall balance assessment: Needs assistance Sitting-balance support: Bilateral upper extremity supported Sitting balance-Leahy Scale: Poor     Standing balance support: Bilateral upper extremity supported Standing balance-Leahy Scale: Poor                      Cognition Arousal/Alertness: Awake/alert Behavior During Therapy: Flat affect Overall Cognitive Status: Impaired/Different from baseline Area of Impairment: Attention;Following commands;Safety/judgement;Awareness;Problem solving       Following Commands: Follows one step commands inconsistently;Follows one step commands with increased time Safety/Judgement: Decreased awareness of deficits          Exercises      General Comments        Pertinent Vitals/Pain Pain Assessment: Faces Faces Pain Scale: Hurts little more Pain Location: LLE Pain Descriptors / Indicators: Grimacing Pain Intervention(s): Monitored during session;Limited activity within patient's tolerance;Repositioned    Home Living                      Prior Function            PT Goals (current goals can now be found in the care plan section) Acute Rehab PT Goals Patient Stated Goal: not expressed PT Goal Formulation: With family Time For Goal Achievement: 07/12/15 Potential to Achieve Goals: Fair Progress towards PT goals: Progressing toward goals    Frequency  Min 3X/week  PT Plan Current plan remains appropriate    Co-evaluation             End of Session Equipment Utilized During Treatment: Gait belt;Oxygen Activity Tolerance: Patient limited by pain Patient left: in chair;with call bell/phone within reach;with family/visitor present     Time: 8413-2440 PT Time Calculation (min) (ACUTE ONLY): 23 min  Charges:  $Therapeutic  Activity: 23-37 mins                    G Codes:      Christiane Ha, PT, CSCS Pager [phone] Office [phone]  06/30/2015, 10:35 AM

## 2015-06-30 NOTE — Telephone Encounter (Signed)
[name] pts daughter (DPR signed) and wants advice from Nicki Reaper NP of what is best rehab for pt to go to once discharged from [facility] for best PT dept. Pt was admitted to [facility] and had surgery for fx leg on 06/27/15.[name] request cb.

## 2015-06-30 NOTE — Telephone Encounter (Signed)
Well, I would recommend [facility] but I am partial to that one because I round there 2 x week.

## 2015-06-30 NOTE — Progress Notes (Signed)
Patient ID: Lisa Evans, female   DOB: 11-10-1923, 81 y.o.   MRN: [id number] Creatinine and H/H improved with transfusion.  Patient awake and follows commands.  i changed her left hip dressing and the incisions look good.  Can continue therapy.  Will need SNF placement.

## 2015-06-30 NOTE — Clinical Social Work Placement (Signed)
   CLINICAL SOCIAL WORK PLACEMENT  NOTE  Date:  06/30/2015  Patient Details  Name: Lisa Evans MRN: [id number] Date of Birth: [birth year]  Clinical Social Work is seeking post-discharge placement for this patient at the Skilled  Nursing Facility level of care (*CSW will initial, date and re-position this form in  chart as items are completed):  Yes   Patient/family provided with [facility] Clinical Social Work Department's list of facilities offering this level of care within the geographic area requested by the patient (or if unable, by the patient's family).  Yes   Patient/family informed of their freedom to choose among providers that offer the needed level of care, that participate in Medicare, Medicaid or managed care program needed by the patient, have an available bed and are willing to accept the patient.  Yes   Patient/family informed of [facility]'s ownership interest in [facility] and [facility], as well as of the fact that they are under no obligation to receive care at these facilities.  PASRR submitted to EDS on 06/30/15     PASRR number received on 06/30/15     Existing PASRR number confirmed on       FL2 transmitted to all facilities in geographic area requested by pt/family on 06/30/15     FL2 transmitted to all facilities within larger geographic area on       Patient informed that his/her managed care company has contracts with or will negotiate with certain facilities, including the following:            Patient/family informed of bed offers received.  Patient chooses bed at       Physician recommends and patient chooses bed at      Patient to be transferred to   on  .  Patient to be transferred to facility by       Patient family notified on   of transfer.  Name of family member notified:        PHYSICIAN Please prepare priority discharge summary, including medications     Additional Comment:     _______________________________________________ Rondel Baton, LCSW 06/30/2015, 1:09 PM

## 2015-06-30 NOTE — Care Management Important Message (Signed)
Important Message  Patient Details  Name: Lisa Evans MRN: [id number] Date of Birth: 05-19-1923   Medicare Important Message Given:  Yes-second notification given    Orson Aloe 06/30/2015, 3:49 PM

## 2015-07-01 DIAGNOSIS — N183 Chronic kidney disease, stage 3 unspecified: Secondary | ICD-10-CM | POA: Insufficient documentation

## 2015-07-01 DIAGNOSIS — N390 Urinary tract infection, site not specified: Secondary | ICD-10-CM | POA: Insufficient documentation

## 2015-07-01 DIAGNOSIS — N179 Acute kidney failure, unspecified: Secondary | ICD-10-CM | POA: Insufficient documentation

## 2015-07-01 DIAGNOSIS — F0391 Unspecified dementia with behavioral disturbance: Secondary | ICD-10-CM

## 2015-07-01 LAB — CBC
HCT: 30 % — ABNORMAL LOW (ref 36.0–46.0)
HEMOGLOBIN: 10.1 g/dL — AB (ref 12.0–15.0)
MCH: 29.8 pg (ref 26.0–34.0)
MCHC: 33.7 g/dL (ref 30.0–36.0)
MCV: 88.5 fL (ref 78.0–100.0)
PLATELETS: 131 10*3/uL — AB (ref 150–400)
RBC: 3.39 MIL/uL — AB (ref 3.87–5.11)
RDW: 16 % — ABNORMAL HIGH (ref 11.5–15.5)
WBC: 7.1 10*3/uL (ref 4.0–10.5)

## 2015-07-01 LAB — URINE CULTURE: Culture: 10000

## 2015-07-01 MED ORDER — RESOURCE THICKENUP CLEAR PO POWD: ORAL | Status: AC

## 2015-07-01 MED ORDER — POLYETHYLENE GLYCOL 3350 17 G PO PACK: 17.0000 g | PACK | Freq: Every day | ORAL | Status: AC

## 2015-07-01 MED ORDER — SENNOSIDES-DOCUSATE SODIUM 8.6-50 MG PO TABS: 1.0000 | ORAL_TABLET | Freq: Two times a day (BID) | ORAL | Status: AC

## 2015-07-01 MED ORDER — LISINOPRIL 10 MG PO TABS: 10.0000 mg | ORAL_TABLET | Freq: Every day | ORAL | Status: AC

## 2015-07-01 MED ORDER — CIPROFLOXACIN HCL 500 MG PO TABS: 500.0000 mg | ORAL_TABLET | Freq: Every day | ORAL | Status: AC

## 2015-07-01 MED ORDER — FERROUS SULFATE 325 (65 FE) MG PO TABS: 325.0000 mg | ORAL_TABLET | Freq: Two times a day (BID) | ORAL | Status: AC

## 2015-07-01 NOTE — Discharge Planning (Signed)
Patient will discharge today per MD order. Patient will discharge to: [facility] SN[facility] RN to call report prior to transportation to: [phone] Room 324  Transportation: [facility]- scheduled at 3:10pm  CSW sent discharge summary to SNF for review.  Packet is complete.  RN, patient and family aware of discharge plans.  Vickii Penna, LCSWA [phone]  Psychiatric & Orthopedics (5N 1-16) Clinical Social Worker

## 2015-07-01 NOTE — Progress Notes (Signed)
Patient discharged to [facility] by [place]. Daughter at bedside to follow. Patient voided before leaving unit.

## 2015-07-01 NOTE — Clinical Social Work Note (Signed)
CSW spoke with patient's daughter and decision maker, [name] who went to view SNF facilities yesterday.  Daughter states she is within a few miles of [address] and enjoyed her tour of the facility.  [name] wishes for patient to be discharged to [facility]) at time of discharge.  [facility] is able to offer a private room for this patient.  Disposition: [facility]  Vickii Penna, [place] [phone]  Psychiatric & Orthopedics (5N 1-8) Clinical Social Worker

## 2015-07-01 NOTE — Telephone Encounter (Signed)
Spoke to [place], pt's daughter. [name] states she has a meeting with admissions at [facility] and has heard good things about [facility]

## 2015-07-01 NOTE — Progress Notes (Signed)
Patient report called in to [facility] at [facility].

## 2015-07-01 NOTE — Clinical Social Work Placement (Signed)
   CLINICAL SOCIAL WORK PLACEMENT  NOTE  Date:  07/01/2015  Patient Details  Name: Lisa Evans MRN: [id number] Date of Birth: 07-20-1923  Clinical Social Work is seeking post-discharge placement for this patient at the Skilled  Nursing Facility level of care (*CSW will initial, date and re-position this form in  chart as items are completed):  Yes   Patient/family provided with [facility] Department's list of facilities offering this level of care within the geographic area requested by the patient (or if unable, by the patient's family).  Yes   Patient/family informed of their freedom to choose among providers that offer the needed level of care, that participate in Medicare, Medicaid or managed care program needed by the patient, have an available bed and are willing to accept the patient.  Yes   Patient/family informed of 's ownership interest in [facility] and [facility], as well as of the fact that they are under no obligation to receive care at these facilities.  PASRR submitted to EDS on 06/30/15     PASRR number received on 06/30/15     Existing PASRR number confirmed on       FL2 transmitted to all facilities in geographic area requested by pt/family on 06/30/15     FL2 transmitted to all facilities within larger geographic area on       Patient informed that his/her managed care company has contracts with or will negotiate with certain facilities, including the following:        Yes   Patient/family informed of bed offers received.  Patient chooses bed at [facility]     Physician recommends and patient chooses bed at      Patient to be transferred to [facility] on 07/01/15.  Patient to be transferred to facility by [facility]     Patient family notified on 07/01/15 of transfer.  Name of family member notified:  daughter [name]     PHYSICIAN Please prepare priority discharge summary, including medications     Additional  Comment:    _______________________________________________ Rondel Baton, LCSW 07/01/2015, 3:19 PM

## 2015-07-01 NOTE — Discharge Summary (Addendum)
Physician Discharge Summary  Lisa Evans ZOX:[id number] DOB: 1923/05/30 DOA: 06/27/2015  PCP: Nicki Reaper, NP  Admit date: 06/27/2015 Discharge date: 07/01/2015  Time spent: >30 minutes  Recommendations for Outpatient Follow-up:  Encourage adequate hydration and nutrition Dysphagia 1 diet with honey thick liquids and crushed meds on apple sauce  Follow up BMET in 5 days to follow electrolytes and renal function Repeat CBC in 5 days to follow Hgb trend Reassess BP and adjust antihypertensive regimen as needed   Discharge Diagnoses:  Principal Problem:   Hip fracture Active Problems:   HTN (hypertension)   HLD (hyperlipidemia)   CAD (coronary artery disease)   Parkinson's disease   Dementia   Closed left hip fracture Urinary retention  E. Coli UTI Acute on chronic renal failure   Discharge Condition: stable and improved. Discharge to SNF for rehabilitation and further care.  Diet recommendation: heart healthy diet; dysphagia 1 with honey thick liquids and crushed meds  Filed Weights   06/29/15 0009  Weight: 45.1 kg (99 lb 6.8 oz)    History of present illness:  81 y.o. female , widowed. Lives with family, ambulates with the help of a walker, extensive PMH-CAD status post CABG 1999, HTN, HLD, Parkinson's disease, mild dementia and chronic kidney disease presented to the [facility] ED on 06/27/15 following a fall at home leading to left hip fracture. Limited history obtained from patient secondary to mental status changes. History obtained from patient's daughter, Ms. Gabrielle [facility]) and son-in-law at bedside. At baseline, patient ambulates short distances only at home utilizing the walker. She does not have any exertional dyspnea or chest pain. This morning they were planning to go out to run some errands. Patient left her walker next to the bathroom and went to her bedroom. While returning, she apparently tripped and fell on her left side. She apparently hit her head also. She  developed excruciating left hip pain and was unable to get up. No loss of consciousness or overt bleeding. No chest pain, palpitations or dyspnea. EMS was called. In the ED, workup significant for hemoglobin 11.6, chest x-ray, left elbow x-ray, CT head & neck without acute findings. However x-ray of the hip/pelvis showed displaced, communited left proximal femur intertrochanteric fracture with angulation.  Hospital Course:  1-Closed left hip fracture: s/p internal fixation on 8/12 -Weight bearing as tolerated -ASA full dose for DVT prophylaxis  -follow up in 2 weeks with Dr. Magnus Ivan (orthopedic service) -base on PT/OT evaluation will discharge to SNF for rehab  2-HTN: will continue home antihypertensive agents.  -reassess BP and adjust antihypertensive regimen as needed  3-acute on chronic renal failure: due to hypotension and decrease perfusion; also continued use of lisinopril and UTI. Stage 3-4 at baseline -improved and back to baseline at discharge  -will resume her lisinopril  -avoid hypotension -continue tx for UTI -Cr 1.16 at discharge -follow renal function trend with BMET in 5 days  4-acute on chronic anemia due to blood loss: -Status post transfusion of 2 units of PRBCs during this admission  -Hemoglobin up to 10.1 at discharge -Will follow Hgb trend with CBC in 5 days -Continue ferrous sulfate BID  5-Parkinson's disease: will continue home medications  6-difficulty swallowing: associated with parkinson's most likely -Per SLP evaluation and rec's will continue Dysphagia 1 honey thick liquids and crushed meds.  7-CAD status post CABG:  -No cardiac symptoms with limited activity at home. Continue aspirin, beta blockers and statins.  -Last Echo 07/20/14: LVEF 50% and akinesis of basal/mid  inferolateral myocardium  8-E. Coli UTI:  -following sensitivity will treat with ciprofloxacin and complete 7 days total of antibiotics (at discharge patient on day 2/7)  -No  fever  9-urinary retention: -Prior to re-insert foley patient was able to urinate and foley was held -will monitor for future needs and assess urinary retention with bladder scan as needed    Procedures:  internal fixation for intertrochanteric hip fracture.  Consultations:  Orthopedic service (Dr. Magnus Ivan)  Discharge Exam: Filed Vitals:   07/01/15 0529  BP: 187/80  Pulse: 80  Temp: 98.1 F (36.7 C)  Resp: 16    General: Afbrile, alert, awake and oriented 2. Denies CP and SOB. Able to void prior to discharge.   Cardiovascular: S1 and S2, no rubs, no gallops, positive murmur   Respiratory: CTA bilaterally  Abdomen: soft, ND. Positive BS  Musculoskeletal: no cyanosis or clubbing.   Discharge Instructions   Discharge Instructions    Diet - low sodium heart healthy    Complete by:  As directed      Discharge instructions    Complete by:  As directed   Encourage adequate hydration and nutrition Dysphagia 1 diet with honey thick liquids and crushed meds on apple sauce  Follow up BMET in 5 days to follow electrolytes and renal function Repeat CBC in 5 days to follow Hgb trend Reassess BP and adjust antihypertensive regimen as needed      Full weight bearing    Complete by:  As directed   Laterality:  left  Extremity:  Lower          Current Discharge Medication List    START taking these medications   Details  ciprofloxacin (CIPRO) 500 MG tablet Take 1 tablet (500 mg total) by mouth daily with breakfast. To be given for 5 days, to complete 7 days of antibiotics total    ferrous sulfate 325 (65 FE) MG tablet Take 1 tablet (325 mg total) by mouth 2 (two) times daily with a meal.    HYDROcodone-acetaminophen (NORCO/VICODIN) 5-325 MG per tablet Take 1-2 tablets by mouth every 6 (six) hours as needed for moderate pain. Qty: 30 tablet, Refills: 0    Maltodextrin-Xanthan Gum (RESOURCE THICKENUP CLEAR) POWD Use as needed to achieve honey thick consistency on  her liquids    polyethylene glycol (MIRALAX / GLYCOLAX) packet Take 17 g by mouth daily. Qty: 14 each, Refills: 0    senna-docusate (SENOKOT-S) 8.6-50 MG per tablet Take 1 tablet by mouth 2 (two) times daily.      CONTINUE these medications which have CHANGED   Details  lisinopril (PRINIVIL,ZESTRIL) 10 MG tablet Take 1 tablet (10 mg total) by mouth daily.      CONTINUE these medications which have NOT CHANGED   Details  acetaminophen (TYLENOL) 500 MG tablet Take 500 mg by mouth every 6 (six) hours as needed.    aspirin 325 MG tablet Take 325 mg by mouth daily.    atorvastatin (LIPITOR) 40 MG tablet Take 1 tablet (40 mg total) by mouth daily. Qty: 30 tablet, Refills: 3    carbidopa-levodopa (SINEMET IR) 25-100 MG per tablet Take 1 tablet by mouth 3 (three) times daily. Qty: 90 tablet, Refills: 5    meclizine (ANTIVERT) 25 MG tablet Take 25 mg by mouth daily.     metoprolol succinate (TOPROL-XL) 25 MG 24 hr tablet TAKE 1 TABLET BY MOUTH DAILY Qty: 30 tablet, Refills: 5       Allergies  Allergen Reactions  .  Penicillins Hives   Follow-up Information    Follow up with Kathryne Hitch, MD. Schedule an appointment as soon as possible for a visit in 2 weeks.   Specialty:  Orthopedic Surgery   Contact information:   [address] [place] [address] [phone]       Schedule an appointment as soon as possible for a visit with Nicki Reaper, NP.   Specialty:  Internal Medicine   Why:  in 10 days after been discharge from SNF   Contact information:   [address] [place] [address] [phone]       The results of significant diagnostics from this hospitalization (including imaging, microbiology, ancillary and laboratory) are listed below for reference.    Significant Diagnostic Studies: Dg Chest 1 View  06/27/2015   CLINICAL DATA:  Larey Seat today onto left side. Preop for hip fracture. History of hypertension and kidney disease.  EXAM:  CHEST  1 VIEW  COMPARISON:  11/18/2014.  FINDINGS: Lung bases partly excluded on the field of view.  There is stable apical pleural parenchymal scarring. Lungs are hyperexpanded. No lung consolidation or edema.  There changes from CABG surgery. Cardiac silhouette is mildly enlarged. No mediastinal or hilar masses or evidence of adenopathy.  Bony thorax is demineralized.  IMPRESSION: 1. No acute cardiopulmonary disease.   Electronically Signed   By: Amie Portland M.D.   On: 06/27/2015 11:48   Dg Elbow Complete Left  06/27/2015   CLINICAL DATA:  Acute left elbow pain after fall today. Initial encounter.  EXAM: LEFT ELBOW - COMPLETE 3+ VIEW  COMPARISON:  None.  FINDINGS: There is no evidence of fracture, dislocation, or joint effusion. There is no evidence of arthropathy or other focal bone abnormality. Soft tissues are unremarkable.  IMPRESSION: No abnormality seen in the left elbow.   Electronically Signed   By: Lupita Raider, M.D.   On: 06/27/2015 11:45   Ct Head Wo Contrast  06/27/2015   CLINICAL DATA:  Larey Seat today and hit head.  EXAM: CT HEAD WITHOUT CONTRAST  CT CERVICAL SPINE WITHOUT CONTRAST  TECHNIQUE: Multidetector CT imaging of the head and cervical spine was performed following the standard protocol without intravenous contrast. Multiplanar CT image reconstructions of the cervical spine were also generated.  COMPARISON:  12/13/2014  FINDINGS: CT HEAD FINDINGS  Stable age related cerebral atrophy, ventriculomegaly and periventricular white matter disease. No extra-axial fluid collections are identified. No CT findings for acute hemispheric infarction or intracranial hemorrhage. No mass lesions. The brainstem and cerebellum are normal.  No acute skull fracture is identified. The paranasal sinuses and mastoid air cells are clear. The globes are intact.  CT CERVICAL SPINE FINDINGS  Advanced degenerative cervical spondylosis with multilevel disc disease and facet disease. No acute fractures identified. No  abnormal prevertebral soft tissue swelling. The skullbase C1 and C1-2 articulations are maintained. The facets are normally aligned. No facet or laminar fractures. The spinal canal is fairly generous and there is no significant canal compromise. Mild foraminal stenosis noted at C4-5 and C5-6 due to uncinate spurring and facet disease.  Moderate carotid artery and vertebral artery calcifications.  IMPRESSION: 1. No acute intracranial findings or skull fracture. Stable age related cerebral atrophy, ventriculomegaly and periventricular white matter disease. 2. Degenerative cervical spondylosis with multilevel disc disease and facet disease but no acute cervical spine fracture.   Electronically Signed   By: Rudie Meyer M.D.   On: 06/27/2015 11:19   Ct Cervical Spine Wo  Contrast  06/27/2015   CLINICAL DATA:  Larey Seat today and hit head.  EXAM: CT HEAD WITHOUT CONTRAST  CT CERVICAL SPINE WITHOUT CONTRAST  TECHNIQUE: Multidetector CT imaging of the head and cervical spine was performed following the standard protocol without intravenous contrast. Multiplanar CT image reconstructions of the cervical spine were also generated.  COMPARISON:  12/13/2014  FINDINGS: CT HEAD FINDINGS  Stable age related cerebral atrophy, ventriculomegaly and periventricular white matter disease. No extra-axial fluid collections are identified. No CT findings for acute hemispheric infarction or intracranial hemorrhage. No mass lesions. The brainstem and cerebellum are normal.  No acute skull fracture is identified. The paranasal sinuses and mastoid air cells are clear. The globes are intact.  CT CERVICAL SPINE FINDINGS  Advanced degenerative cervical spondylosis with multilevel disc disease and facet disease. No acute fractures identified. No abnormal prevertebral soft tissue swelling. The skullbase C1 and C1-2 articulations are maintained. The facets are normally aligned. No facet or laminar fractures. The spinal canal is fairly generous and  there is no significant canal compromise. Mild foraminal stenosis noted at C4-5 and C5-6 due to uncinate spurring and facet disease.  Moderate carotid artery and vertebral artery calcifications.  IMPRESSION: 1. No acute intracranial findings or skull fracture. Stable age related cerebral atrophy, ventriculomegaly and periventricular white matter disease. 2. Degenerative cervical spondylosis with multilevel disc disease and facet disease but no acute cervical spine fracture.   Electronically Signed   By: Rudie Meyer M.D.   On: 06/27/2015 11:19   Dg C-arm 1-60 Min  06/27/2015   CLINICAL DATA:  Left intertrochanteric fracture  EXAM: DG C-ARM 61-120 MIN; LEFT FEMUR 2 VIEWS  COMPARISON:  None  FLUOROSCOPY TIME:  1 minutes 47 seconds  Four images  FINDINGS: ORIF left intertrochanteric fracture transfixed with an intra medullary nail and 2 interlocking cannulated femoral neck screws. Near anatomic alignment. Lesser trochanteric fragment is medially displaced. No hip dislocation.  IMPRESSION: Interval ORIF left intertrochanteric fracture.   Electronically Signed   By: Elige Ko   On: 06/27/2015 17:31   Dg Hip Unilat With Pelvis 2-3 Views Left  06/27/2015   CLINICAL DATA:  Patient fell today onto her left side. Severe left hip pain with limited range of motion.  EXAM: DG HIP (WITH OR WITHOUT PELVIS) 2-3V LEFT  COMPARISON:  01/21/2015  FINDINGS: There is a displaced, comminuted and central fracture of the proximal left femur. Left hip joint is normally aligned.  Primary fracture components are displaced approximately 16 mm. There is varus angulation. There is a separate fracture components of the greater and lesser trochanters. Lesser trochanter fracture is across the base and is displaced medially by 13 mm.  Bones are diffusely demineralized.  No pelvic fracture.  IMPRESSION: 1. Displaced, comminuted left proximal femur intertrochanteric fracture with varus angulation.   Electronically Signed   By: Amie Portland  M.D.   On: 06/27/2015 11:43   Dg Femur Min 2 Views Left  06/27/2015   CLINICAL DATA:  Left intertrochanteric fracture  EXAM: DG C-ARM 61-120 MIN; LEFT FEMUR 2 VIEWS  COMPARISON:  None  FLUOROSCOPY TIME:  1 minutes 47 seconds  Four images  FINDINGS: ORIF left intertrochanteric fracture transfixed with an intra medullary nail and 2 interlocking cannulated femoral neck screws. Near anatomic alignment. Lesser trochanteric fragment is medially displaced. No hip dislocation.  IMPRESSION: Interval ORIF left intertrochanteric fracture.   Electronically Signed   By: Elige Ko   On: 06/27/2015 17:31   Dg Femur [employer]  2 Views Left  06/27/2015   CLINICAL DATA:  T14.8 (ICD-10-CM) - FracturePrevious images for hip and femurPatient and family unable to tolerate more images  EXAM: LEFT FEMUR PORTABLE 2 VIEWS  COMPARISON:  01/21/2015  FINDINGS: There is an intertrochanteric fracture of the proximal left femur. There is mild displacement, of approximately 2 cm, as well as varus angulation.  No other fractures.  Knee and hip joints are normally aligned.  There is proximal left thigh and left lateral hip soft tissue swelling.  IMPRESSION: Intertrochanteric fracture of the proximal left femur.   Electronically Signed   By: Amie Portland M.D.   On: 06/27/2015 13:07    Microbiology: Recent Results (from the past 240 hour(s))  Surgical pcr screen     Status: Abnormal   Collection Time: 06/27/15  2:33 PM  Result Value Ref Range Status   MRSA, PCR NEGATIVE NEGATIVE Final   Staphylococcus aureus POSITIVE (A) NEGATIVE Final    Comment:        The Xpert SA Assay (FDA approved for NASAL specimens in patients over 59 years of age), is one component of a comprehensive surveillance program.  Test performance has been validated by [facility] for patients greater than or equal to 73 year old. It is not intended to diagnose infection nor to guide or monitor treatment.   Culture, Urine     Status: None   Collection  Time: 06/29/15  2:23 AM  Result Value Ref Range Status   Specimen Description URINE, CATHETERIZED  Final   Special Requests NONE  Final   Culture 10,000 COLONIES/mL ESCHERICHIA COLI  Final   Report Status 07/01/2015 FINAL  Final   Organism ID, Bacteria ESCHERICHIA COLI  Final      Susceptibility   Escherichia coli - MIC*    AMPICILLIN 8 SENSITIVE Sensitive     CEFAZOLIN <=4 SENSITIVE Sensitive     CEFTRIAXONE <=1 SENSITIVE Sensitive     CIPROFLOXACIN <=0.25 SENSITIVE Sensitive     GENTAMICIN <=1 SENSITIVE Sensitive     IMIPENEM <=0.25 SENSITIVE Sensitive     NITROFURANTOIN 32 SENSITIVE Sensitive     TRIMETH/SULFA <=20 SENSITIVE Sensitive     AMPICILLIN/SULBACTAM 4 SENSITIVE Sensitive     PIP/TAZO <=4 SENSITIVE Sensitive     * 10,000 COLONIES/mL ESCHERICHIA COLI     Labs: Basic Metabolic Panel:  Recent Labs Lab 06/27/15 1039 06/28/15 0533 06/29/15 0117 06/30/15 0455  NA 137 133* 133* 136  K 3.9 4.9 4.8 4.5  CL 99* 100* 103 108  CO2 28 23 22 22   GLUCOSE 135* 241* 152* 94  BUN 16 24* 34* 26*  CREATININE 1.06* 1.94* 2.11* 1.16*  CALCIUM 9.9 8.2* 8.1* 8.2*   Liver Function Tests:  Recent Labs Lab 06/29/15 0117  AST 25  ALT 8*  ALKPHOS 49  BILITOT 0.7  PROT 4.8*  ALBUMIN 2.5*   CBC:  Recent Labs Lab 06/27/15 1039 06/28/15 2233 06/29/15 2028 06/30/15 0455 07/01/15 0700  WBC 6.3 10.0 7.8 7.8 7.1  NEUTROABS 4.7  --   --   --   --   HGB 11.6* 7.0* 9.2* 9.8* 10.1*  HCT 35.4* 21.0* 27.3* 28.9* 30.0*  MCV 93.2 90.5 87.5 88.4 88.5  PLT 194 150 108* 100* 131*    Signed:  Vassie Loll  [facility] 07/01/2015, 1:38 PM

## 2015-07-03 DIAGNOSIS — S7292XA Unspecified fracture of left femur, initial encounter for closed fracture: Secondary | ICD-10-CM

## 2015-07-03 DIAGNOSIS — E441 Mild protein-calorie malnutrition: Secondary | ICD-10-CM

## 2015-07-03 DIAGNOSIS — G20 Parkinson's disease: Secondary | ICD-10-CM

## 2015-07-03 DIAGNOSIS — F0150 Vascular dementia without behavioral disturbance: Secondary | ICD-10-CM

## 2015-07-03 DIAGNOSIS — R410 Disorientation, unspecified: Secondary | ICD-10-CM

## 2015-07-03 DIAGNOSIS — N183 Chronic kidney disease, stage 3 (moderate): Secondary | ICD-10-CM

## 2015-07-08 DIAGNOSIS — R2242 Localized swelling, mass and lump, left lower limb: Secondary | ICD-10-CM

## 2015-07-14 DIAGNOSIS — S71002A Unspecified open wound, left hip, initial encounter: Secondary | ICD-10-CM

## 2015-07-22 ENCOUNTER — Telehealth: Payer: Self-pay | Admitting: Internal Medicine

## 2015-07-22 ENCOUNTER — Telehealth: Payer: Self-pay

## 2015-07-22 ENCOUNTER — Other Ambulatory Visit: Payer: Self-pay | Admitting: Internal Medicine

## 2015-07-22 MED ORDER — MORPHINE SULFATE (CONCENTRATE) 20 MG/ML PO SOLN: ORAL | Status: AC

## 2015-07-22 NOTE — Telephone Encounter (Signed)
To PCP

## 2015-07-22 NOTE — Telephone Encounter (Signed)
Spoke to [place] and she states pt is having great difficulty swallowing and was wondering if pt could get the Morphine liquid  per mL  as pt has not been able to take pain medication and as it is believed she will not be here much longer wants her to have as much relief as possible---please advise

## 2015-07-22 NOTE — Telephone Encounter (Signed)
Arline Asp, RN case Production designer, theatre/television/film from [facility] called requesting comfort medication for pt Lisa Evans. Arline Asp states she was admitted to hospice yesterday 07/21/15 and is actively dying   Please fax order to [facility]  Please call Arline Asp once med is called in [phone]

## 2015-07-22 NOTE — Telephone Encounter (Signed)
Call in ativan 0.5 mg. Ok to take Q4h prn # 60, ) refills  Does she want an order for pain medication? That can't be called in?

## 2015-07-22 NOTE — Telephone Encounter (Signed)
See 07/22/15 phone note.

## 2015-07-22 NOTE — Telephone Encounter (Signed)
PLEASE NOTE: All timestamps contained within this report are represented as [place] Standard Time. CONFIDENTIALTY NOTICE: This fax transmission is intended only for the addressee. It contains information that is legally privileged, confidential or otherwise protected from use or disclosure. If you are not the intended recipient, you are strictly prohibited from reviewing, disclosing, copying using or disseminating any of this information or taking any action in reliance on or regarding this information. If you have received this fax in error, please notify us immediately by telephone so that we can arrange for its return to [place]. Phone: [phone], Toll-Free: [phone], Fax: [phone] Page: 1 of 2 Call Id: [id number] [facility] Night - Client TELEPHONE ADVICE RECORD [facility] Patient Name: Lisa Evans Gender: Female DOB: 1923-03-31 Age: 81 Y 2 M 15 D Return Phone Number: Address: City/State/Zip: [place] Client [facility] Night - Client Client Site [facility] - Night Physician [place] [facility]ide Contact Type Call Call Type Triage / Clinical Caller Name Amil Amen at [facility] [place] Relationship To Patient Provider Return Phone Number Please choose phone number Chief Complaint Paging or Request for Consult Initial Comment Caller states Amil Amen at [facility], [phone], the family is requesting hospice come out the patient is not eating and family states she is going down hill fast. She is needing an order to be able to go out. She is needing her demographics and last office note Nurse Assessment Nurse: Yetta Barre, RN, Miranda Date/Time (Eastern Time): 07/21/2015 11:41:46 AM Confirm and document reason for call. If symptomatic, describe symptoms. ---Caller states they received a call from the family about patient being unable to swallow and not eating. She had a recent hip fracture with rapid  decline. The family is requesting hospice. Caller can take a verbal order for hospice. Has the patient traveled out of the country within the last 30 days? ---Not Applicable Does the patient require triage? ---No Guidelines Guideline Title Affirmed Question Affirmed Notes Nurse Date/Time (Eastern Time) Disp. Time [name] Time) Disposition Final User 07/21/2015 11:33:21 AM Send to Urgent Queue Zebedee Iba 07/21/2015 11:44:26 AM Paged On Call back to Call [place], RN, Miranda 07/21/2015 12:07:02 PM Clinical Call Yes Yetta Barre, RN, Miranda After Care Instructions Given Call Event Type User Date / Time Description Comments User: Grier Rocher, RN Date/Time [name] Time): 07/21/2015 12:06:52 PM Spoke with caller and told ok to start hospice services. Transferred to the nurse at the agency, Rosey Bath, gave verbal order for hospice. PLEASE NOTE: All timestamps contained within this report are represented as [place] Standard Time. CONFIDENTIALTY NOTICE: This fax transmission is intended only for the addressee. It contains information that is legally privileged, confidential or otherwise protected from use or disclosure. If you are not the intended recipient, you are strictly prohibited from reviewing, disclosing, copying using or disseminating any of this information or taking any action in reliance on or regarding this information. If you have received this fax in error, please notify us immediately by telephone so that we can arrange for its return to [place]. Phone: [phone], Toll-Free: [phone], Fax: [phone] Page: 2 of 2 Call Id: [id number] Paging DoctorName Phone DateTime Result/Outcome Message Type Notes Danise Edge [phone] 07/21/2015 11:44:26 AM Paged On Call Back to Call Center Doctor Paged Please call Randi at the call center. [phone] Danise Edge 07/21/2015 11:52:20 AM Spoke with On Call - General Message Result Ok to start hospice services,.

## 2015-07-22 NOTE — Telephone Encounter (Signed)
Rx faxed [place]  

## 2015-07-22 NOTE — Telephone Encounter (Signed)
TX printed and signed and given to [facility]

## 2015-07-23 ENCOUNTER — Telehealth: Payer: Self-pay

## 2015-07-23 ENCOUNTER — Other Ambulatory Visit: Payer: Self-pay | Admitting: Internal Medicine

## 2015-07-23 MED ORDER — LORAZEPAM 0.5 MG PO TABS: 0.5000 mg | ORAL_TABLET | ORAL | Status: AC | PRN

## 2015-07-23 NOTE — Addendum Note (Signed)
Addended by: Roena Malady on: 07/23/2015 01:22 PM   Modules accepted: Orders

## 2015-07-23 NOTE — Telephone Encounter (Signed)
Oral or gel?

## 2015-07-23 NOTE — Telephone Encounter (Signed)
Cindy with [facility] left v/m requesting lorazepam for anxiety as comfort measure to [facility] pharmacy. Cindy request cb.

## 2015-07-23 NOTE — Telephone Encounter (Signed)
Rx called in to pharmacy. 

## 2015-07-28 ENCOUNTER — Telehealth: Payer: Self-pay | Admitting: Internal Medicine

## 2015-07-28 ENCOUNTER — Telehealth: Payer: Self-pay | Admitting: Neurology

## 2015-07-28 NOTE — Telephone Encounter (Signed)
Yes I put one on your desk

## 2015-07-28 NOTE — Telephone Encounter (Signed)
Called daughter and expressed condolences.

## 2015-07-28 NOTE — Telephone Encounter (Signed)
Patient passed away on 07/20/2015 at 10:55 pm.

## 2015-07-28 NOTE — Telephone Encounter (Signed)
Noted  

## 2015-07-28 NOTE — Telephone Encounter (Signed)
Patient passed away yesterday Jul 31, 2015

## 2015-08-01 ENCOUNTER — Ambulatory Visit: Payer: Medicare Other | Admitting: Internal Medicine

## 2015-08-16 DEATH — deceased

## 2015-10-13 ENCOUNTER — Ambulatory Visit: Payer: Medicare Other | Admitting: Neurology

## 2015-12-12 IMAGING — US US RENAL
1 series · 14 of 25 positions shown · non-contrast
Comparison: None.

CLINICAL DATA: Chronic renal disease

EXAM:
RENAL/URINARY TRACT ULTRASOUND COMPLETE

[Series 1: us renal · 0.18mm/px · 14 of 25 slices shown]
[im 1/25]
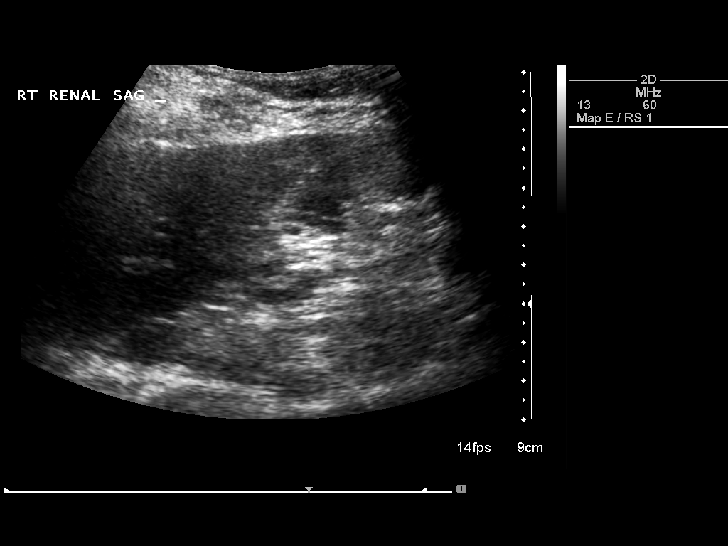
[im 3/25]
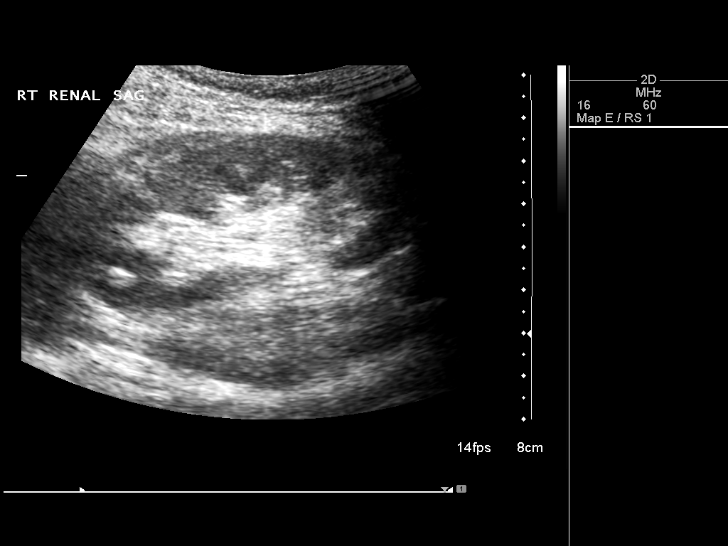
[im 5/25]
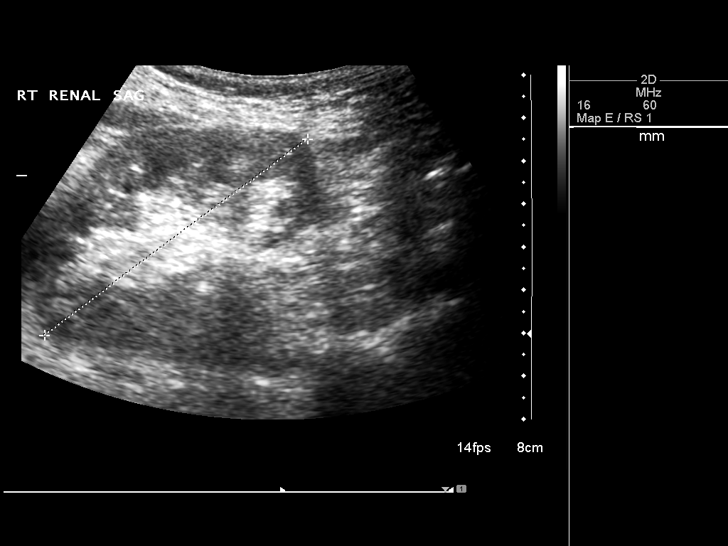
[im 7/25]
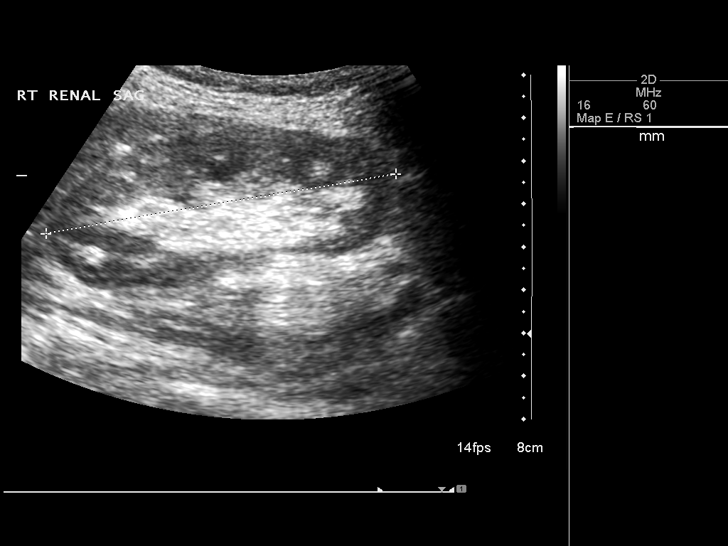
[im 9/25]
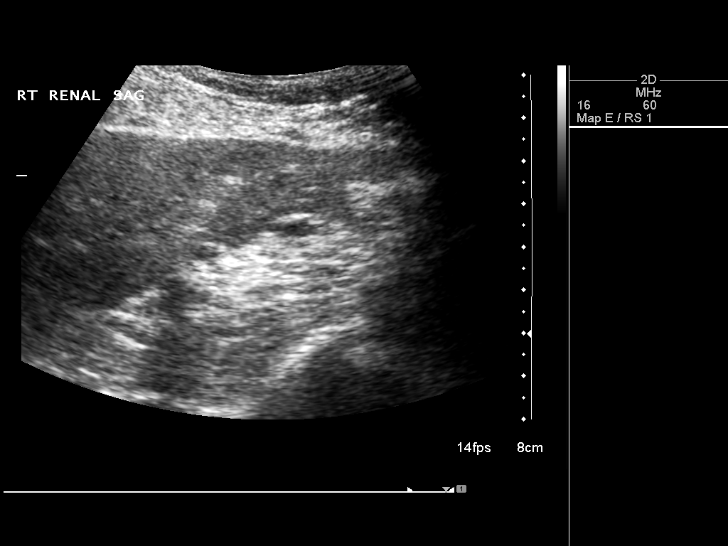
[im 10/25]
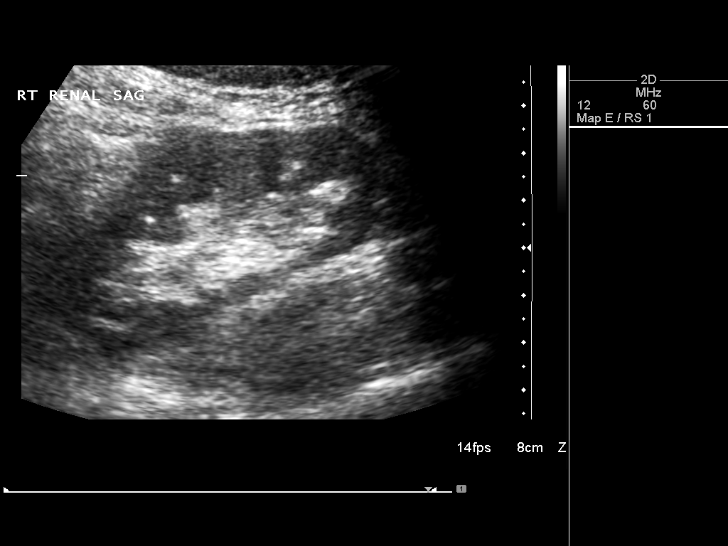
[im 12/25]
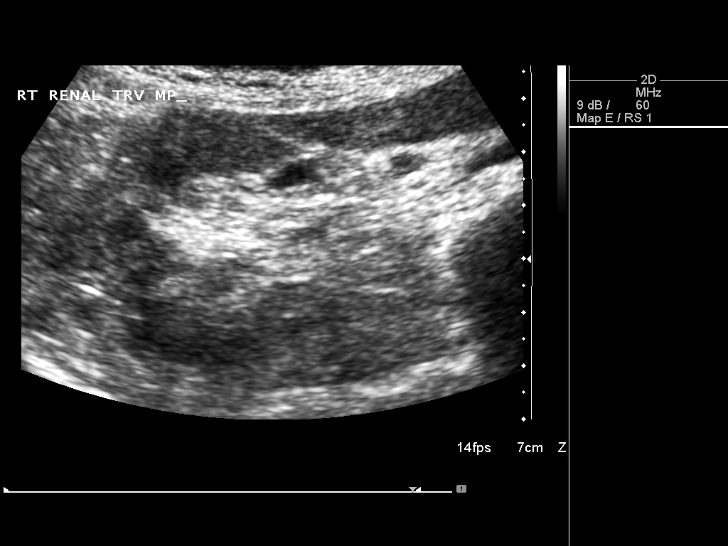
[im 14/25]
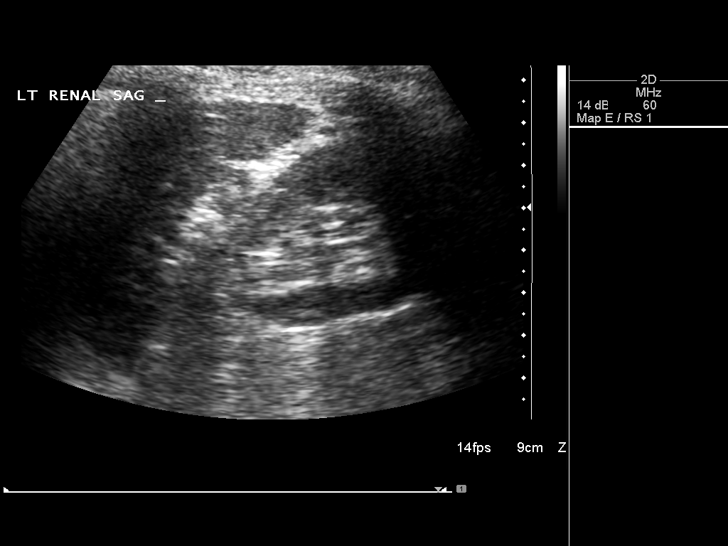
[im 16/25]
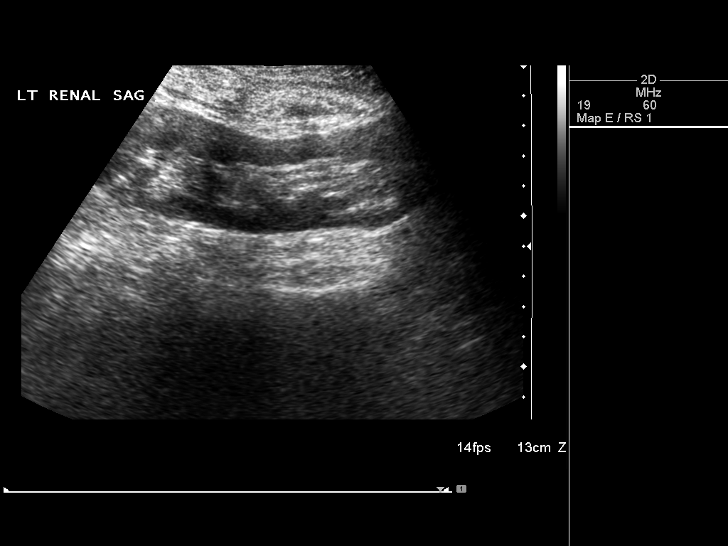
[im 17/25]
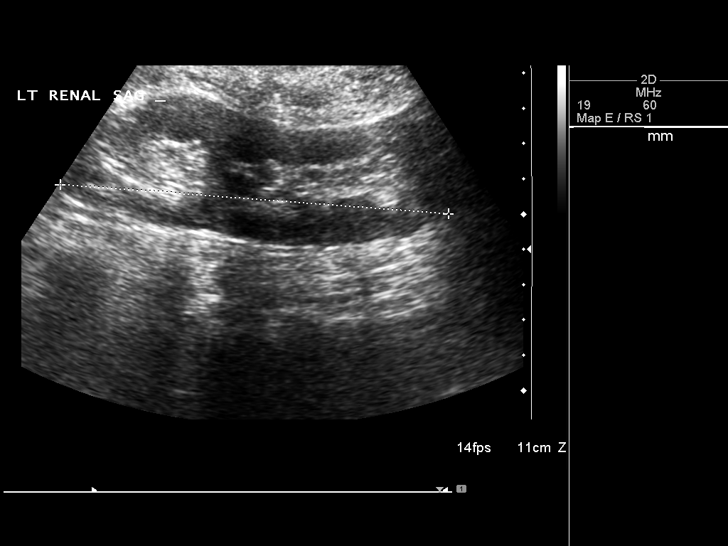
[im 19/25]
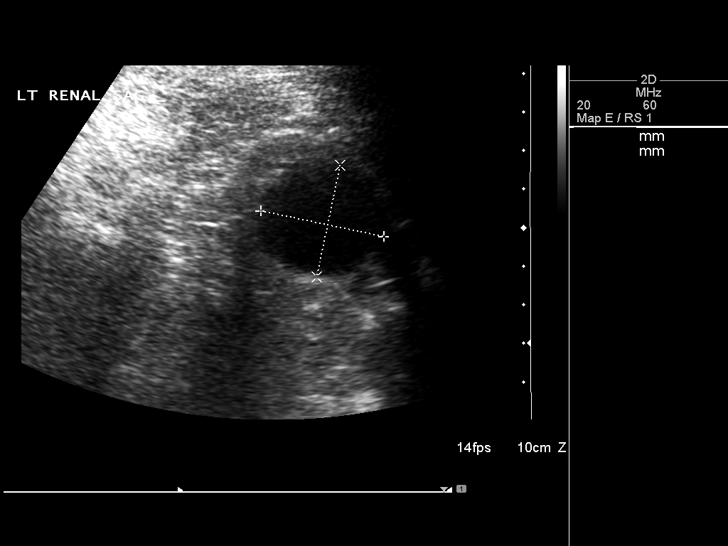
[im 21/25]
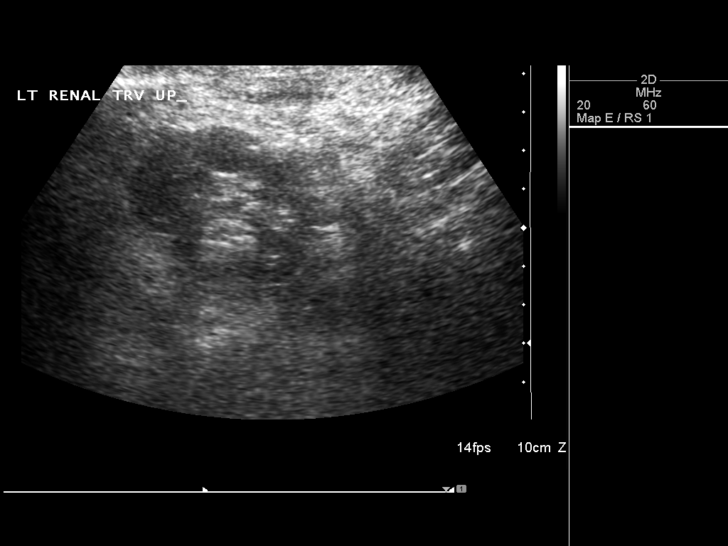
[im 23/25]
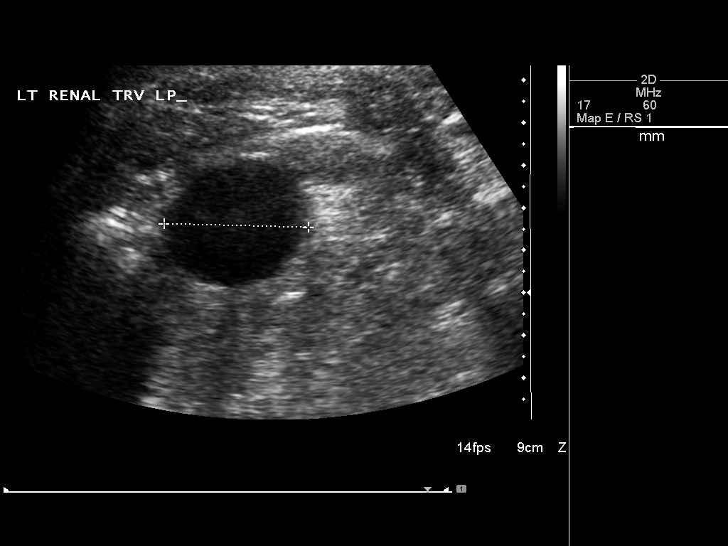
[im 25/25]
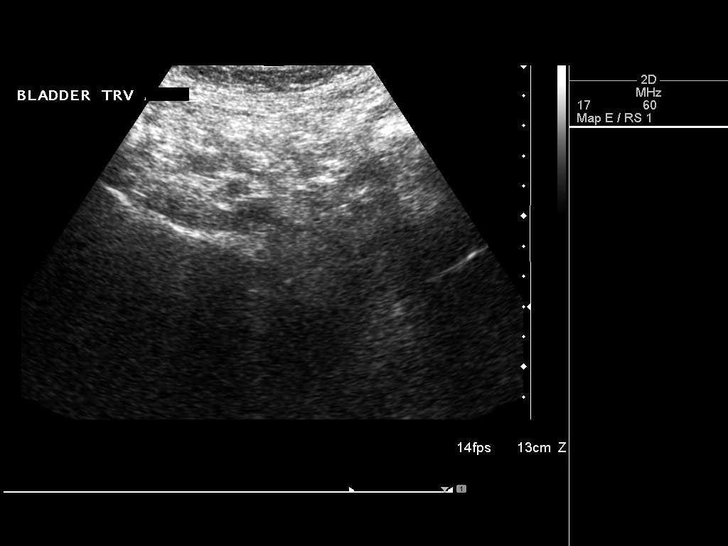

[14 of 25 positions shown; findings below may reference images not displayed]

FINDINGS: Right Kidney:

Length: 8.2 cm..  Slight increased echogenicity is noted.

Left Kidney:

Length: 11.0 cm..  A 3.3 cm cyst is noted in the lower pole.

Bladder:

Decompressed
IMPRESSION: Left renal cyst.

Small right kidney with slight increased echogenicity. This may be
related to mild medical renal disease.

## 2016-01-19 IMAGING — MR MR MRA NECK WO/W CM
12 of 16 series · 23 of 48 positions shown · IV contrast (Yes)
Comparison: Head CT without contrast 12/13/2014.

CLINICAL DATA: 89-year-old female with severe headaches and
dizziness for 3 weeks. Fall with head injury in [REDACTED]. No loss of
consciousness. Difficulty walking, confusion. Initial encounter.

EXAM:
MRI HEAD WITHOUT CONTRAST
MRA HEAD WITHOUT CONTRAST
MRA NECK WITHOUT AND WITH CONTRAST
TECHNIQUE: Multiplanar, multiecho pulse sequences of the brain and surrounding
structures were obtained without intravenous contrast. Angiographic
images of the Circle of Willis were obtained using MRA technique
without intravenous contrast. Angiographic images of the neck were
obtained using MRA technique without and with intravenous contrast.
Carotid stenosis measurements (when applicable) are obtained
utilizing NASCET criteria, using the distal internal carotid
diameter as the denominator.
CONTRAST:  10mL MULTIHANCE GADOBENATE DIMEGLUMINE 529 MG/ML IV SOLN

[Series 4: FLAIR · sagittal · 5.0mm · 0.47mm/px · 1 of 30 slices shown (1 of 2)]
[im 1/30]
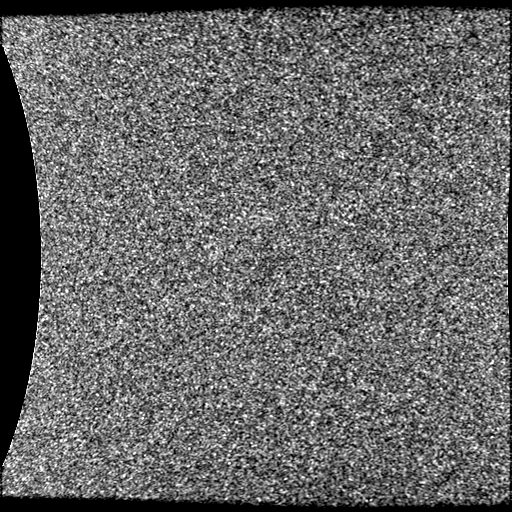

[Series 5: DWI · axial · 3.0mm · 1.09mm/px · z∈[-101,+72]mm · 4 of 120 slices shown (1 of 4)]
[im 1/120]
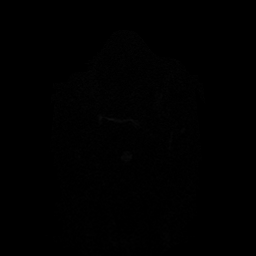
[im 40/120]
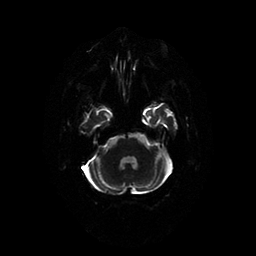
[im 80/120]
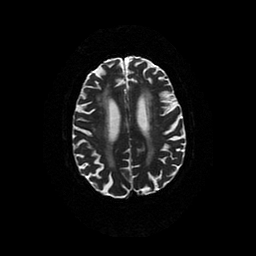
[im 120/120]
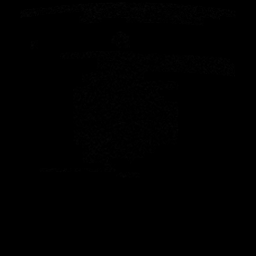

[Series 6: DWI · coronal · 5.0mm · 1.09mm/px · 3 of 79 slices shown (2 of 4)]
[im 1/79]
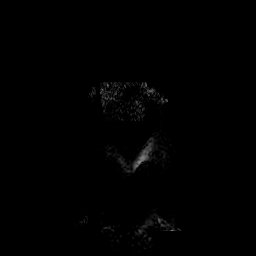
[im 40/79]
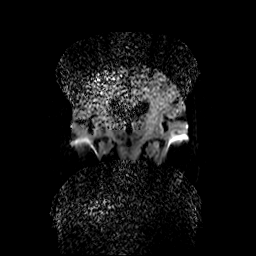
[im 79/79]
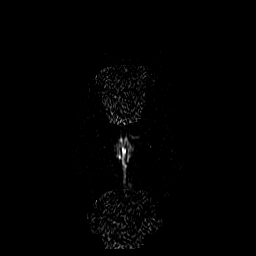

[Series 7: T2-star · axial · 5.0mm · 0.43mm/px · 1 of 28 slices shown]
[im 1/28]
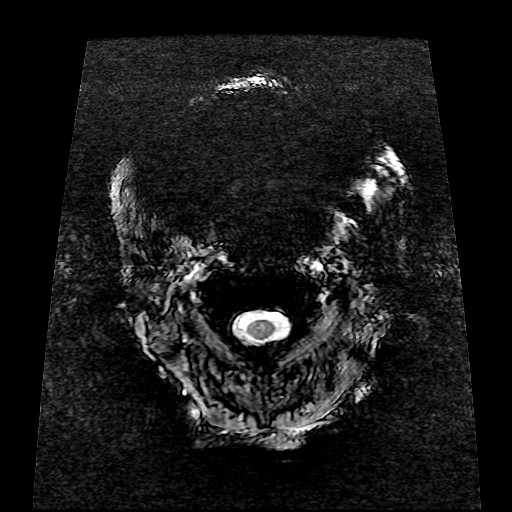

[Series 8: (id) · axial · 5.0mm · 0.43mm/px · 1 of 28 slices shown]
[im 1/28]
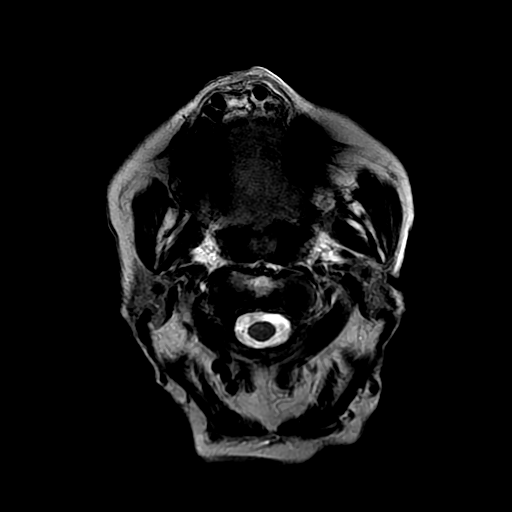

[Series 9: FLAIR · axial · 5.0mm · 0.43mm/px · 1 of 28 slices shown (2 of 2)]
[im 1/28]
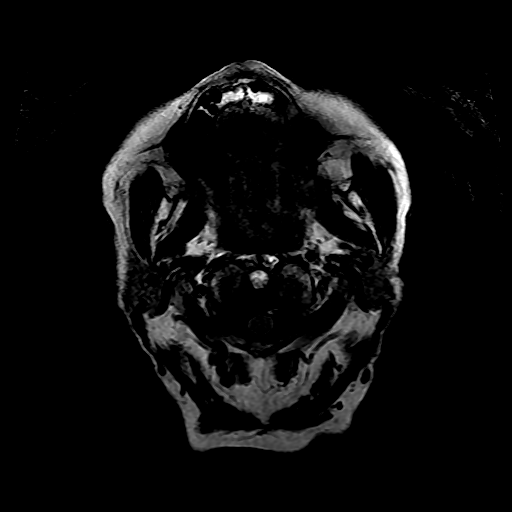

[Series 11: T2 · coronal · 5.0mm · 0.43mm/px · 1 of 34 slices shown]
[im 1/34]
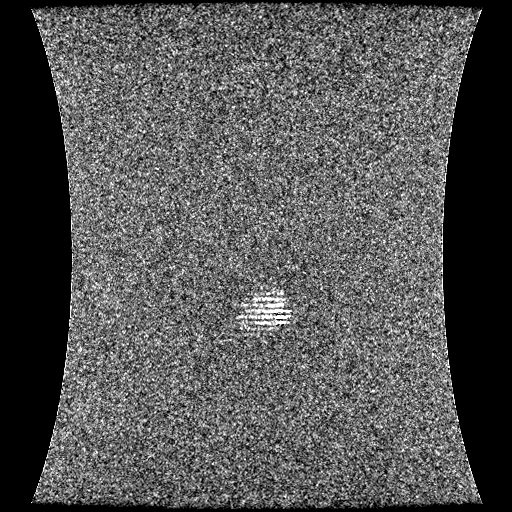

[Series 14: TOF · axial · 3.0mm · 0.47mm/px · z∈[-287,-48]mm · 3 of 87 slices shown]
[im 1/87]
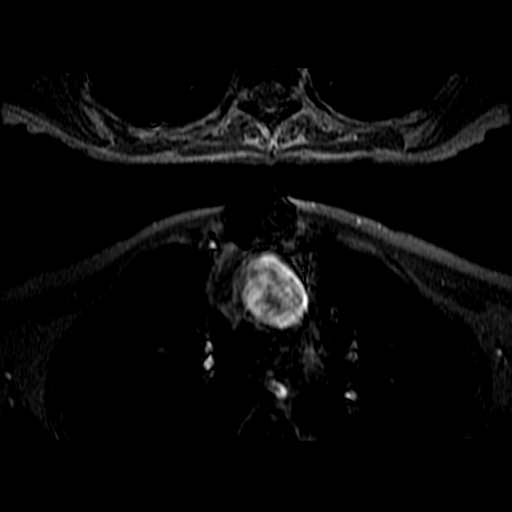
[im 44/87]
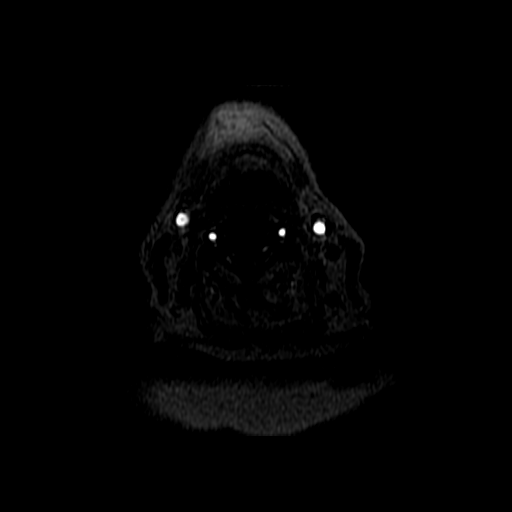
[im 87/87]
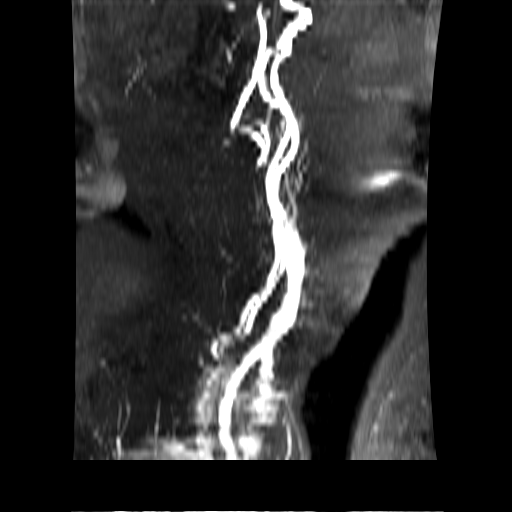

[Series 15: cemra carotids(mask)* · coronal · 1.6mm · 0.59mm/px · 3 of 89 slices shown]
[im 1/89]
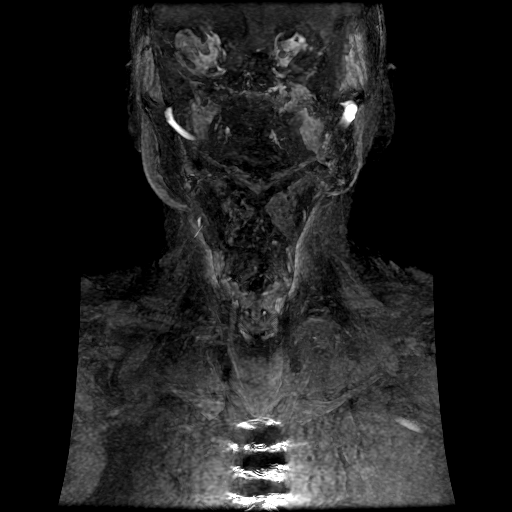
[im 45/89]
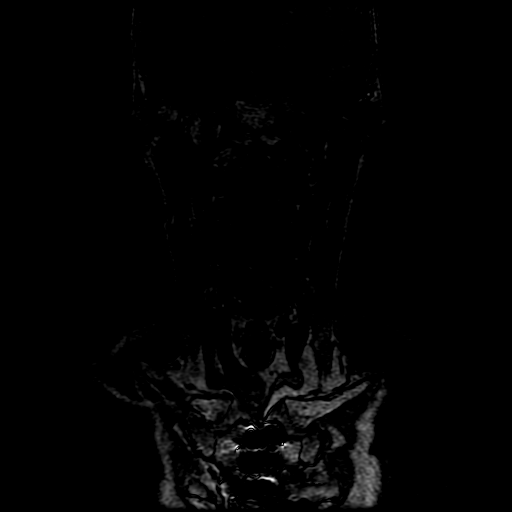
[im 89/89]
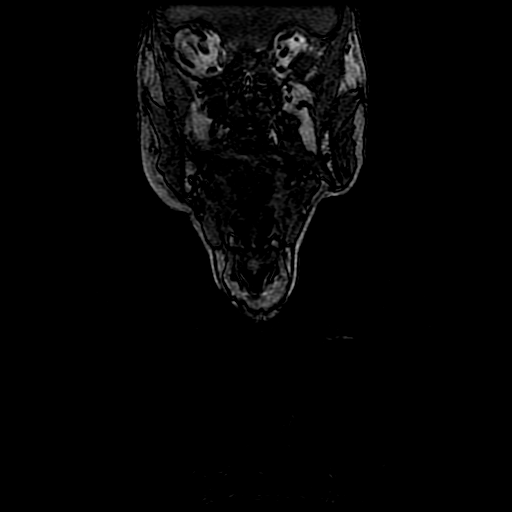

[Series 16: cemra carotids multiphase* · coronal · 1.6mm · 0.59mm/px · 2 of 176 slices shown]
[im 1/176]
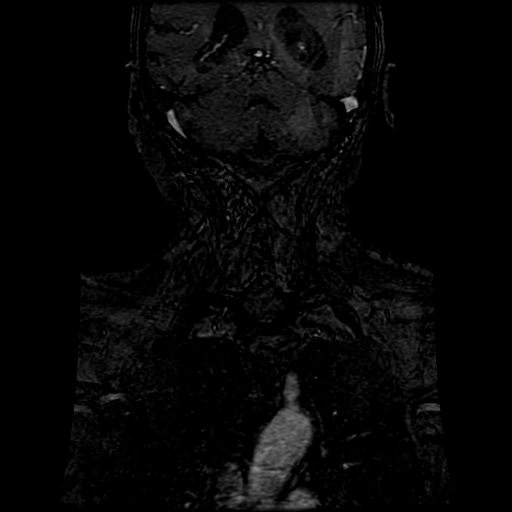
[im 36/176]
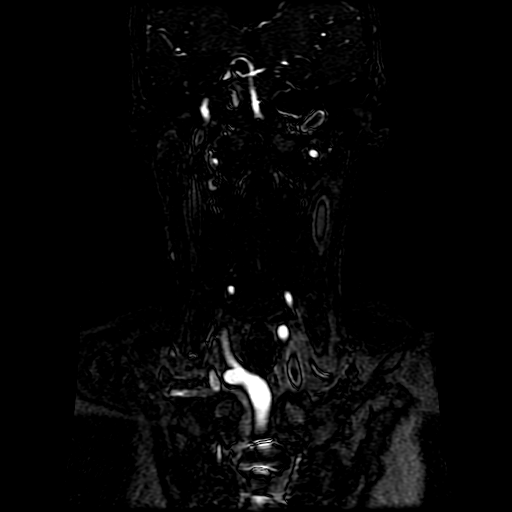

[Series 500: DWI · axial · 3.0mm · 1.09mm/px · z∈[-101,+54]mm · 2 of 54 slices shown (3 of 4)]
[im 1/54]
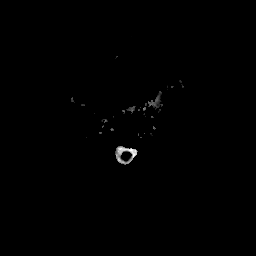
[im 54/54]
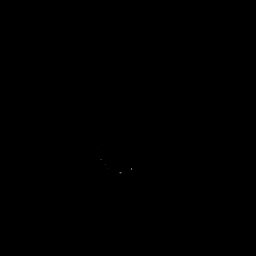

[Series 600: DWI · coronal · 5.0mm · 1.09mm/px · 1 of 40 slices shown (4 of 4)]
[im 1/40]
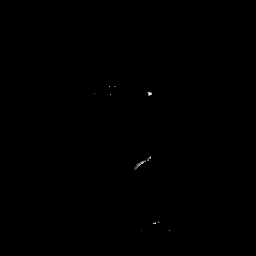

[23 of 48 positions shown; findings below may reference images not displayed]

FINDINGS: MRI HEAD FINDINGS

Diffusion weighted imaging suggests a punctate area of left
periventricular white matter restriction (series 5, image 37 and
series 500, image 37). There is confluent bilateral periventricular
and central white matter T2 and FLAIR hyperintensity. Elsewhere
diffusion weighted imaging is normal. Major intracranial vascular
flow voids are within normal limits.

Cerebral volume is within normal limits for age. Mild scaphocephaly.
Mild ex vacuo ventricular enlargement. Mild for age T2 heterogeneity
in the deep gray matter. Tiny chronic lacunar infarct in the left
cerebellum (series 8, image 8). Brainstem within normal limits. No
cortical encephalomalacia. No chronic blood products identified in
the brain. No midline shift, mass effect, evidence of mass lesion,
ventriculomegaly, extra-axial collection or acute intracranial
hemorrhage. Cervicomedullary junction and pituitary are within
normal limits.

Visible internal auditory structures appear normal. Mastoids are
clear. Trace paranasal sinus mucosal thickening. Postoperative
changes to both globes. Visualized scalp soft tissues are within
normal limits. Normal bone marrow signal. Partially visible cervical
disc and endplate degeneration at C3-C4.

MRA HEAD FINDINGS

Antegrade flow in both distal vertebral arteries, but asymmetrically
decreased on the left. Patent vertebrobasilar junction. Both AICA
superior dominant. Mild basilar artery irregularity without basilar
artery stenosis. SCA and PCA origins are within normal limits.
Posterior communicating arteries are diminutive or absent. Left PCA
branches are within normal limits. There is moderate to severe
stenosis in the right PCA P1 segment. There is a tandem moderate to
severe stenosis in the right P2 segment. Subsequently there is
attenuated distal right PCA flow signal.

Antegrade flow in both ICA siphons. Left siphon irregularity without
stenosis. Moderate supra clinoid right ICA irregularity and mild to
moderate stenosis (series 3909, image 6). Patent carotid termini
with symmetric flow signal. Ophthalmic artery origins are within
normal limits. MCA and ACA origins are within normal limits.

Motion artifact at the level of the A2 and M2 branches. No definite
proximal MCA or ACA stenosis. No definite major MCA or ACA branch
occlusion.

MRA NECK FINDINGS

Precontrast time-of-flight imaging reveals antegrade flow in both
carotid and vertebral arteries in the neck.

Post-contrast MRA images reveal a 3 vessel arch configuration with
no great vessel origin stenosis.

Normal right CCA origin. Normal right CCA. At the right carotid
bifurcation there is moderate irregularity and filling defect in the
posterior right ICA bulb. Subsequent stenosis up to fifty-five %
with respect to the distal vessel. Cervical right ICA otherwise is
negative.

Normal left CCA proximal to the bifurcation. At moderate
irregularity at the left carotid bifurcation, with irregularity of
the left ICA origin and bulb, but less than 50 % stenosis with
respect to the distal vessel results. Negative cervical left ICA
otherwise.

No proximal right subclavian artery stenosis. Normal right vertebral
artery origin. Negative right vertebral artery to the TIGER tube row
basilar junction.

No proximal left subclavian artery stenosis. There is high-grade
stenosis of the left vertebral artery origin over a segment of 6 mm
(radiographic string sign series 3841, image 9). Despite this the
left vertebral artery is patent, and otherwise normal to the
vertebrobasilar junction.
IMPRESSION: 1. Suggestion of a punctate left periatrial white matter lacunar
infarct. No mass effect or hemorrhage. This might be clinically
silent.
2. High-grade stenosis (radiographic string sign) at the left
vertebral artery origin. Normal right vertebral artery.
3. Bilateral carotid bifurcation atherosclerosis. 55% stenosis at
the right ICA bulb. Less than 50% proximal left ICA stenosis.
4. Up to moderate right ICA siphon supra clinoid segment stenosis.
Otherwise negative for age anterior circulation; motion artifact
degrades detail of the ACA and MCA branches.
5. Tandem moderate to severe stenoses of the right PCA, with
attenuated distal PCA flow signal.
6. Moderate for age cerebral white matter predominant signal changes
in the brain compatible with chronic small vessel disease.
